# Patient Record
Sex: Female | Born: 1985 | Race: Black or African American | Hispanic: No | Marital: Single | Smoking: Never smoker
Health system: Southern US, Community
[De-identification: ages and names within clinical notes are randomized; demographics above are authoritative.]

## PROBLEM LIST (undated history)

## (undated) DIAGNOSIS — N760 Acute vaginitis: Secondary | ICD-10-CM

## (undated) DIAGNOSIS — F419 Anxiety disorder, unspecified: Secondary | ICD-10-CM

## (undated) DIAGNOSIS — F32A Depression, unspecified: Secondary | ICD-10-CM

## (undated) DIAGNOSIS — F329 Major depressive disorder, single episode, unspecified: Secondary | ICD-10-CM

## (undated) DIAGNOSIS — N920 Excessive and frequent menstruation with regular cycle: Secondary | ICD-10-CM

## (undated) DIAGNOSIS — R87619 Unspecified abnormal cytological findings in specimens from cervix uteri: Secondary | ICD-10-CM

## (undated) DIAGNOSIS — N289 Disorder of kidney and ureter, unspecified: Secondary | ICD-10-CM

## (undated) DIAGNOSIS — B9689 Other specified bacterial agents as the cause of diseases classified elsewhere: Secondary | ICD-10-CM

## (undated) HISTORY — DX: Acute vaginitis: N76.0

## (undated) HISTORY — DX: Major depressive disorder, single episode, unspecified: F32.9

## (undated) HISTORY — PX: CYSTOSCOPY: SUR368

## (undated) HISTORY — DX: Depression, unspecified: F32.A

## (undated) HISTORY — DX: Unspecified abnormal cytological findings in specimens from cervix uteri: R87.619

## (undated) HISTORY — DX: Anxiety disorder, unspecified: F41.9

## (undated) HISTORY — DX: Other specified bacterial agents as the cause of diseases classified elsewhere: B96.89

## (undated) HISTORY — DX: Excessive and frequent menstruation with regular cycle: N92.0

---
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
## 2008-04-04 ENCOUNTER — Inpatient Hospital Stay: Payer: Self-pay | Admitting: Obstetrics and Gynecology

## 2010-08-15 ENCOUNTER — Emergency Department: Payer: Self-pay | Admitting: Emergency Medicine

## 2010-10-23 ENCOUNTER — Emergency Department: Payer: Self-pay | Admitting: Emergency Medicine

## 2012-08-04 ENCOUNTER — Emergency Department: Payer: Self-pay | Admitting: Emergency Medicine

## 2012-08-04 LAB — URINALYSIS, COMPLETE
Bilirubin,UR: NEGATIVE
Blood: NEGATIVE
Glucose,UR: NEGATIVE mg/dL (ref 0–75)
Ketone: NEGATIVE
Nitrite: NEGATIVE
Ph: 9 (ref 4.5–8.0)
Protein: 30
Squamous Epithelial: 2
WBC UR: 7 /HPF (ref 0–5)

## 2012-08-04 LAB — COMPREHENSIVE METABOLIC PANEL
Albumin: 3.8 g/dL (ref 3.4–5.0)
Anion Gap: 7 (ref 7–16)
Bilirubin,Total: 0.3 mg/dL (ref 0.2–1.0)
Calcium, Total: 8.9 mg/dL (ref 8.5–10.1)
Creatinine: 0.72 mg/dL (ref 0.60–1.30)
EGFR (African American): >60
EGFR (Non-African Amer.): >60
SGPT (ALT): 21 U/L (ref 12–78)
Total Protein: 7.9 g/dL (ref 6.4–8.2)

## 2012-08-04 LAB — CBC
HCT: 36.9 % (ref 35.0–47.0)
HGB: 12.5 g/dL (ref 12.0–16.0)
MCV: 85 fL (ref 80–100)
Platelet: 289 10*3/uL (ref 150–440)
RDW: 13.4 % (ref 11.5–14.5)

## 2012-08-04 LAB — LIPASE, BLOOD: Lipase: 109 U/L (ref 73–393)

## 2017-02-14 ENCOUNTER — Emergency Department: Payer: BC Managed Care – PPO

## 2017-02-14 ENCOUNTER — Emergency Department
Admission: EM | Admit: 2017-02-14 | Discharge: 2017-02-14 | Disposition: A | Discharge status: Home / Self Care | Payer: BC Managed Care – PPO | Attending: Student in an Organized Health Care Education/Training Program | Admitting: Student in an Organized Health Care Education/Training Program

## 2017-02-14 ENCOUNTER — Other Ambulatory Visit: Payer: Self-pay

## 2017-02-14 DIAGNOSIS — R197 Diarrhea, unspecified: Secondary | ICD-10-CM

## 2017-02-14 DIAGNOSIS — R112 Nausea with vomiting, unspecified: Secondary | ICD-10-CM | POA: Insufficient documentation

## 2017-02-14 DIAGNOSIS — R1084 Generalized abdominal pain: Secondary | ICD-10-CM | POA: Insufficient documentation

## 2017-02-14 DIAGNOSIS — R1011 Right upper quadrant pain: Secondary | ICD-10-CM

## 2017-02-14 LAB — URINALYSIS, COMPLETE (UACMP) WITH MICROSCOPIC
Bacteria, UA: NONE SEEN
Bilirubin Urine: NEGATIVE
GLUCOSE, UA: NEGATIVE mg/dL
KETONES UR: 20 mg/dL — AB
Nitrite: NEGATIVE
PH: 5 (ref 5.0–8.0)
Protein, ur: NEGATIVE mg/dL
Specific Gravity, Urine: 1.019 (ref 1.005–1.030)

## 2017-02-14 LAB — CBC
HCT: 40.7 % (ref 35.0–47.0)
Hemoglobin: 13.7 g/dL (ref 12.0–16.0)
MCH: 28.5 pg (ref 26.0–34.0)
MCHC: 33.7 g/dL (ref 32.0–36.0)
MCV: 84.4 fL (ref 80.0–100.0)
PLATELETS: 243 10*3/uL (ref 150–440)
RBC: 4.82 MIL/uL (ref 3.80–5.20)
RDW: 13.5 % (ref 11.5–14.5)
WBC: 8.1 10*3/uL (ref 3.6–11.0)

## 2017-02-14 LAB — COMPREHENSIVE METABOLIC PANEL
ALT: 23 U/L (ref 14–54)
AST: 22 U/L (ref 15–41)
Albumin: 4.2 g/dL (ref 3.5–5.0)
Alkaline Phosphatase: 63 U/L (ref 38–126)
Anion gap: 11 (ref 5–15)
BUN: 8 mg/dL (ref 6–20)
CHLORIDE: 106 mmol/L (ref 101–111)
CO2: 22 mmol/L (ref 22–32)
CREATININE: 0.88 mg/dL (ref 0.44–1.00)
Calcium: 9.3 mg/dL (ref 8.9–10.3)
GFR calc non Af Amer: >60 mL/min (ref >60)
Glucose, Bld: 99 mg/dL (ref 65–99)
POTASSIUM: 3.8 mmol/L (ref 3.5–5.1)
Sodium: 139 mmol/L (ref 135–145)
Total Bilirubin: 0.5 mg/dL (ref 0.3–1.2)
Total Protein: 7.7 g/dL (ref 6.5–8.1)

## 2017-02-14 LAB — LIPASE, BLOOD: LIPASE: 31 U/L (ref 11–51)

## 2017-02-14 LAB — POCT PREGNANCY, URINE: Preg Test, Ur: NEGATIVE

## 2017-02-14 MED ORDER — PROMETHAZINE HCL 25 MG/ML IJ SOLN
25.0000 mg | Freq: Four times a day (QID) | INTRAMUSCULAR | Status: DC | PRN
  Filled 2017-02-14: qty 1

## 2017-02-14 MED ORDER — PROMETHAZINE HCL 12.5 MG PO TABS: 12.5000 mg | ORAL_TABLET | Freq: Four times a day (QID) | ORAL | 0 refills | Status: DC | PRN

## 2017-02-14 MED ORDER — DICYCLOMINE HCL 10 MG PO CAPS
10.0000 mg | ORAL_CAPSULE | Freq: Once | ORAL | Status: AC
Start: 2017-02-14 — End: 2017-02-14
  Administered 2017-02-14: 10 mg via ORAL
  Filled 2017-02-14: qty 1

## 2017-02-14 MED ORDER — DICYCLOMINE HCL 10 MG PO CAPS: 10.0000 mg | ORAL_CAPSULE | Freq: Three times a day (TID) | ORAL | 0 refills | Status: DC | PRN

## 2017-02-14 MED ORDER — KETOROLAC TROMETHAMINE 30 MG/ML IJ SOLN
15.0000 mg | Freq: Once | INTRAMUSCULAR | Status: AC
  Administered 2017-02-14: 15 mg via INTRAMUSCULAR
  Filled 2017-02-14: qty 1

## 2017-02-14 MED ORDER — PROMETHAZINE HCL 25 MG/ML IJ SOLN
25.0000 mg | Freq: Four times a day (QID) | INTRAMUSCULAR | Status: DC | PRN
  Administered 2017-02-14: 25 mg via INTRAMUSCULAR

## 2017-02-14 NOTE — ED Notes (Signed)
Pt states she has abd pain with n/v/d.  No back pain  Pt denies urinary sx. Pt alert.  Family with pt.

## 2017-02-14 NOTE — ED Provider Notes (Signed)
Jessica Madden    First MD Initiated Contact with Patient 02/14/17 2131     (approximate)  I have reviewed the triage vital signs and the nursing notes.   HISTORY  Chief Complaint Abdominal Pain; Emesis; and Diarrhea    HPI Jessica Madden is a 32 y.o. female presents with chief complaint of nausea vomiting diarrhea associated with crampy moderate to severe and intermittent abdominal pain primarily located in the epigastric area and right upper quadrant since yesterday.  Patient and her mother just recently [place] from [place].  Denies any blood in her diarrhea.  Denies any vaginal discharge or dysuria.  She is on Depakote.  Denies any chance of being pregnant.  No chest pain or shortness of breath.  No personal or family history of inflammatory bowel disease.  No past medical history on file. No family history on file.  There are no active problems to display for this patient.     Prior to Admission medications   Not on File    Allergies Patient has no known allergies.    Social History Social History   Tobacco Use  . Smoking status: Not on file  Substance Use Topics  . Alcohol use: Not on file  . Drug use: Not on file    Review of Systems Patient denies headaches, rhinorrhea, blurry vision, numbness, shortness of breath, chest pain, edema, cough, abdominal pain, nausea, vomiting, diarrhea, dysuria, fevers, rashes or hallucinations unless otherwise stated above in HPI. ____________________________________________   PHYSICAL EXAM:  VITAL SIGNS: Vitals:   02/14/17 1833  BP: 108/64  Pulse: 84  Resp: 18  Temp: 98.1 F (36.7 C)  SpO2: 100%    Constitutional: Alert and oriented. Well appearing and in no acute distress. Eyes: Conjunctivae are normal.  Head: Atraumatic. Nose: No congestion/rhinnorhea. Mouth/Throat: Mucous membranes are moist.   Neck: No stridor. Painless ROM.  Cardiovascular:  Normal rate, regular rhythm. Grossly normal heart sounds.  Good peripheral circulation. Respiratory: Normal respiratory effort.  No retractions. Lungs CTAB. Gastrointestinal: Soft and with mild epigastric ttp. No distention. No abdominal bruits. No CVA tenderness. Genitourinary: deferred Musculoskeletal: No lower extremity tenderness nor edema.  No joint effusions. Neurologic:  Normal speech and language. No gross focal neurologic deficits are appreciated. No facial droop Skin:  Skin is warm, dry and intact. No rash noted. Psychiatric: Mood and affect are normal. Speech and behavior are normal.  ____________________________________________   LABS (all labs ordered are listed, but only abnormal results are displayed)  Results for orders placed or performed during the hospital encounter of 02/14/17 (from the past 24 hour(s))  Lipase, blood     Status: None   Collection Time: 02/14/17  6:30 PM  Result Value Ref Range   Lipase 31 11 - 51 U/L  Comprehensive metabolic panel     Status: None   Collection Time: 02/14/17  6:30 PM  Result Value Ref Range   Sodium 139 135 - 145 mmol/L   Potassium 3.8 3.5 - 5.1 mmol/L   Chloride 106 101 - 111 mmol/L   CO2 22 22 - 32 mmol/L   Glucose, Bld 99 65 - 99 mg/dL   BUN 8 6 - 20 mg/dL   Creatinine, Ser 1.610.88 0.44 - 1.00 mg/dL   Calcium 9.3 8.9 - 09.610.3 mg/dL   Total Protein 7.7 6.5 - 8.1 g/dL   Albumin 4.2 3.5 - 5.0 g/dL   AST 22 15 - 41 U/L   ALT 23  14 - 54 U/L   Alkaline Phosphatase 63 38 - 126 U/L   Total Bilirubin 0.5 0.3 - 1.2 mg/dL   GFR calc non Af Amer >60 >60 mL/min   GFR calc Af Amer >60 >60 mL/min   Anion gap 11 5 - 15  CBC     Status: None   Collection Time: 02/14/17  6:30 PM  Result Value Ref Range   WBC 8.1 3.6 - 11.0 K/uL   RBC 4.82 3.80 - 5.20 MIL/uL   Hemoglobin 13.7 12.0 - 16.0 g/dL   HCT 16.1 09.6 - 04.5 %   MCV 84.4 80.0 - 100.0 fL   MCH 28.5 26.0 - 34.0 pg   MCHC 33.7 32.0 - 36.0 g/dL   RDW 40.9 81.1 - 91.4 %   Platelets  243 150 - 440 K/uL  Urinalysis, Complete w Microscopic     Status: Abnormal   Collection Time: 02/14/17  6:30 PM  Result Value Ref Range   Color, Urine YELLOW (A) YELLOW   APPearance CLOUDY (A) CLEAR   Specific Gravity, Urine 1.019 1.005 - 1.030   pH 5.0 5.0 - 8.0   Glucose, UA NEGATIVE NEGATIVE mg/dL   Hgb urine dipstick MODERATE (A) NEGATIVE   Bilirubin Urine NEGATIVE NEGATIVE   Ketones, ur 20 (A) NEGATIVE mg/dL   Protein, ur NEGATIVE NEGATIVE mg/dL   Nitrite NEGATIVE NEGATIVE   Leukocytes, UA MODERATE (A) NEGATIVE   RBC / HPF 6-30 0 - 5 RBC/hpf   WBC, UA 6-30 0 - 5 WBC/hpf   Bacteria, UA NONE SEEN NONE SEEN   Squamous Epithelial / LPF 6-30 (A) NONE SEEN   Mucus PRESENT   Pregnancy, urine POC     Status: None   Collection Time: 02/14/17  6:47 PM  Result Value Ref Range   Preg Test, Ur NEGATIVE NEGATIVE   ____________________________________________  __________________________________  RADIOLOGY  I personally reviewed all radiographic images ordered to evaluate for the above acute complaints and reviewed radiology reports and findings.  These findings were personally discussed with the patient.  Please see medical record for radiology report.  ____________________________________________   PROCEDURES  Procedure(s) performed:  Procedures    Critical Care performed: no ____________________________________________   INITIAL IMPRESSION / ASSESSMENT AND PLAN / ED COURSE  Pertinent labs & imaging results that were available during my care of the patient were reviewed by me and considered in my medical decision making (see chart for details).  DDX: cholecystitis, cholelithiasis, enteritis, gastritis, travelers diarrhea, appendicitis  Jessica Madden is a 32 y.o. who presents to the ED with crampy abdominal pain as described above.  Associated with diarrhea.  Patient is afebrile Heema dynamically stable.  Her blood work is reassuring.  Is not clinically consistent with  appendicitis.  Ultrasound ordered to rule out cholelithiasis cholecystitis.  Patient given IM Phenergan as well as Toradol with improvement in symptoms.  Do not feel that emergent CT imaging is clinically indicated.  Patient stable for a period of observation as an outpatient.  Encouraged patient to return to the ER or seek medical attention in 12-24 hours if symptoms not improved.  Have discussed with the patient and available family all diagnostics and treatments performed thus far and all questions were answered to the best of my ability. The patient demonstrates understanding and agreement with plan.       ____________________________________________   FINAL CLINICAL IMPRESSION(S) / ED DIAGNOSES  Final diagnoses:  RUQ abdominal pain  Diarrhea of presumed infectious origin  NEW MEDICATIONS STARTED DURING THIS VISIT:  New Prescriptions   No medications on file     Madden:  This document was prepared using Dragon voice recognition software and may include unintentional dictation errors.    Willy Eddy, MD 02/15/17 [facility]

## 2017-02-14 NOTE — Discharge Instructions (Signed)

## 2017-02-14 NOTE — ED Triage Notes (Signed)
Pt reports that she began yesterday having generalized abd pain with nausea, vomiting and diarrhea.

## 2017-03-14 ENCOUNTER — Ambulatory Visit (INDEPENDENT_AMBULATORY_CARE_PROVIDER_SITE_OTHER): Payer: BC Managed Care – PPO | Admitting: Maternal Newborn

## 2017-03-14 ENCOUNTER — Encounter: Payer: Self-pay | Admitting: Maternal Newborn

## 2017-03-14 VITALS — BP 100/70 | HR 84 | Ht 62.5 in | Wt 164.0 lb

## 2017-03-14 DIAGNOSIS — Z13220 Encounter for screening for lipoid disorders: Secondary | ICD-10-CM

## 2017-03-14 DIAGNOSIS — Z113 Encounter for screening for infections with a predominantly sexual mode of transmission: Secondary | ICD-10-CM | POA: Diagnosis not present

## 2017-03-14 DIAGNOSIS — Z124 Encounter for screening for malignant neoplasm of cervix: Secondary | ICD-10-CM

## 2017-03-14 DIAGNOSIS — Z01419 Encounter for gynecological examination (general) (routine) without abnormal findings: Secondary | ICD-10-CM

## 2017-03-14 DIAGNOSIS — Z3042 Encounter for surveillance of injectable contraceptive: Secondary | ICD-10-CM

## 2017-03-14 MED ORDER — MEDROXYPROGESTERONE ACETATE 150 MG/ML IM SUSY: 150.0000 mg | PREFILLED_SYRINGE | INTRAMUSCULAR | 4 refills | Status: DC

## 2017-03-14 NOTE — Progress Notes (Signed)
Gynecology Annual Exam  PCP: System, Pcp Not In  Chief Complaint:  Chief Complaint  Patient presents with  . Gynecologic Exam    Wants STI scr., HIV and chol; refill phentermine    History of Present Illness: Patient is a 32 y.o. [id number] presenting for annual exam. The patient has no complaints today.   LMP: No LMP recorded (lmp unknown). Patient has had an injection. Menses absent with DepoProvera. Postcoital Bleeding: no  The patient is sexually active. She currently uses Depo-Provera injections for contraception. She denies dyspareunia.  The patient does not perform self breast exams.  There is no notable family history of breast or ovarian cancer in her family.  The patient wears seatbelts: yes.   The patient has regular exercise: no.    The patient denies current symptoms of depression.    Review of Systems  Constitutional: Negative for malaise/fatigue and weight loss.  HENT: Negative.   Eyes: Negative.   Respiratory: Negative for cough, shortness of breath and wheezing.   Cardiovascular: Negative for chest pain and palpitations.  Gastrointestinal: Negative for abdominal pain, constipation, diarrhea, heartburn and nausea.  Genitourinary: Negative.   Musculoskeletal: Negative.   Skin: Negative.   Neurological: Negative.   Endo/Heme/Allergies: Negative.   Psychiatric/Behavioral: Negative for depression. The patient is not nervous/anxious.   All other systems reviewed and are negative.   Past Medical History:  Past Medical History:  Diagnosis Date  . Abnormal Pap smear of cervix 2007  . Anxiety   . Bacterial vaginosis   . Depression   . Menorrhagia     Past Surgical History:  History reviewed. No pertinent surgical history.  Gynecologic History:  No LMP recorded (lmp unknown). Patient has had an injection. Contraception: Depo-Provera injections Last Pap: 03/04/2016  Results were: NIL  Obstetric History: G2P2002  Family History:  Family History    Problem Relation Age of Onset  . Diabetes Mother   . Hypertension Mother   . Diabetes Maternal Grandfather   . Cancer Maternal Grandfather 15       lung    Social History:  Social History   Socioeconomic History  . Marital status: Single    Spouse name: Not on file  . Number of children: 2  . Years of education: 35  . Highest education level: Not on file  Social Needs  . Financial resource strain: Not on file  . Food insecurity - worry: Not on file  . Food insecurity - inability: Not on file  . Transportation needs - medical: Not on file  . Transportation needs - non-medical: Not on file  Occupational History  . Occupation: Engineer, site    Employer: [employer]  Tobacco Use  . Smoking status: Never Smoker  . Smokeless tobacco: Never Used  Substance and Sexual Activity  . Alcohol use: Yes    Comment: rarely  . Drug use: No  . Sexual activity: Yes    Birth control/protection: Injection  Other Topics Concern  . Not on file  Social History Narrative  . Not on file    Allergies:  Allergies  Allergen Reactions  . Pineapple Rash    Medications: Prior to Admission medications   Medication Sig Start Date End Date Taking? Authorizing Provider  MedroxyPROGESTERone Acetate 150 MG/ML SUSY Inject 1 mL (150 mg total) into the muscle every 3 (three) months. 03/14/17  Yes Oswaldo Conroy, CNM  triamcinolone ointment (KENALOG) 0.1 % Apply 1 application topically as needed. 04/27/16 04/27/17 Yes [provider]    Physical Exam Vitals: Blood pressure 100/70, pulse 84, height 5' 2.5" (1.588 m), weight 164 lb (74.4 kg).  General: NAD HEENT: normocephalic, anicteric Thyroid: no enlargement, no palpable nodules Pulmonary: No increased work of breathing, CTAB Cardiovascular: RRR, S1 and S2 auscultated, no murmurs, rubs or gallops Breast: Breasts symmetrical, no tenderness, no palpable nodules or masses, no skin or nipple retraction present, no nipple discharge.   No axillary or supraclavicular lymphadenopathy. Abdomen: soft, non-tender, non-distended.  Umbilicus without lesions.  No hepatomegaly, splenomegaly or masses palpable. No evidence of hernia  Genitourinary:  External: Normal external female genitalia.  Normal urethral  meatus, normal Bartholin's and Skene's glands.    Vagina: Normal vaginal mucosa, no evidence of prolapse.    Cervix: Grossly normal in appearance, no bleeding  Uterus: Non-enlarged, mobile, normal contour.  No CMT  Adnexa: ovaries non-enlarged, no adnexal masses  Rectal: deferred  Lymphatic: no evidence of inguinal lymphadenopathy Extremities: no edema, erythema, or tenderness Neurologic: Grossly intact Psychiatric: mood appropriate, affect full  Assessment: 32 y.o. G2P2002 routine annual exam.  Plan: Problem List Items Addressed This Visit    None    Visit Diagnoses    Pap smear for cervical cancer screening    -  Primary   Relevant Orders   Pap IG, Ct-Ng TV HPV-hr   Routine screening for STI (sexually transmitted infection)       Relevant Orders   HEP, RPR, HIV Panel   Hepatitis C antibody   HSV(herpes smplx)abs-1+2(IgG+IgM)-bld   Pap IG, Ct-Ng TV HPV-hr   Encounter for surveillance of injectable contraceptive       Relevant Medications   MedroxyPROGESTERone Acetate 150 MG/ML SUSY   Screening for cholesterol level       Relevant Orders   Lipid panel      1) STI screening was offered and accepted.  2) ASCCP guidelines and rationale discussed.  Patient opts for yearly screening interval.  3) Contraception - Patient currently satisfied with DepoProvera and wishes to continue injectable contraception.  4) Routine healthcare maintenance including cholesterol, diabetes screening discussed: cholesterol screening ordered today.  5) Desires phentermine Rx, advised to make visit with MD for ongoing supervision and refill.  6) Follow up 1 year for routine annual exam.  Marcelyn BruinsJacelyn Reiana Poteet, CNM 03/14/2017  4:56  PM

## 2017-03-16 LAB — HSV(HERPES SMPLX)ABS-I+II(IGG+IGM)-BLD
HSV 1 Glycoprotein G Ab, IgG: 39.6 index — ABNORMAL HIGH (ref 0.00–0.90)
HSVI/II COMB AB IGM: 1.7 ratio — AB (ref 0.00–0.90)

## 2017-03-16 LAB — HEPATITIS C ANTIBODY

## 2017-03-16 LAB — HEP, RPR, HIV PANEL
HEP B S AG: NEGATIVE
HIV Screen 4th Generation wRfx: NONREACTIVE
RPR Ser Ql: NONREACTIVE

## 2017-03-17 LAB — PAP IG, CT-NG TV HPV-HR
CHLAMYDIA, NUC. ACID AMP: NEGATIVE
GONOCOCCUS, NUC. ACID AMP: NEGATIVE
HPV, HIGH-RISK: NEGATIVE
PAP SMEAR COMMENT: 0
Trich vag by NAA: NEGATIVE

## 2017-03-24 ENCOUNTER — Ambulatory Visit: Payer: Self-pay | Admitting: Advanced Practice Midwife

## 2017-03-29 ENCOUNTER — Ambulatory Visit: Payer: Self-pay | Admitting: Advanced Practice Midwife

## 2017-07-08 ENCOUNTER — Encounter: Payer: Self-pay | Admitting: Emergency Medicine

## 2017-07-08 DIAGNOSIS — N23 Unspecified renal colic: Secondary | ICD-10-CM | POA: Diagnosis not present

## 2017-07-08 DIAGNOSIS — N132 Hydronephrosis with renal and ureteral calculous obstruction: Secondary | ICD-10-CM | POA: Diagnosis not present

## 2017-07-08 DIAGNOSIS — R109 Unspecified abdominal pain: Secondary | ICD-10-CM | POA: Diagnosis present

## 2017-07-08 LAB — URINALYSIS, COMPLETE (UACMP) WITH MICROSCOPIC
Bacteria, UA: NONE SEEN
Bilirubin Urine: NEGATIVE
Glucose, UA: NEGATIVE mg/dL
KETONES UR: NEGATIVE mg/dL
Leukocytes, UA: NEGATIVE
Nitrite: NEGATIVE
PH: 6 (ref 5.0–8.0)
PROTEIN: NEGATIVE mg/dL
Specific Gravity, Urine: 1.021 (ref 1.005–1.030)

## 2017-07-08 LAB — COMPREHENSIVE METABOLIC PANEL
ALBUMIN: 3.8 g/dL (ref 3.5–5.0)
ALT: 16 U/L (ref 14–54)
AST: 21 U/L (ref 15–41)
Alkaline Phosphatase: 65 U/L (ref 38–126)
Anion gap: 11 (ref 5–15)
BUN: 13 mg/dL (ref 6–20)
CHLORIDE: 105 mmol/L (ref 101–111)
CO2: 22 mmol/L (ref 22–32)
CREATININE: 1.14 mg/dL — AB (ref 0.44–1.00)
Calcium: 9.1 mg/dL (ref 8.9–10.3)
GFR calc non Af Amer: >60 mL/min (ref >60)
Glucose, Bld: 97 mg/dL (ref 65–99)
Potassium: 3.8 mmol/L (ref 3.5–5.1)
SODIUM: 138 mmol/L (ref 135–145)
Total Bilirubin: 0.4 mg/dL (ref 0.3–1.2)
Total Protein: 7.1 g/dL (ref 6.5–8.1)

## 2017-07-08 LAB — LIPASE, BLOOD: LIPASE: 28 U/L (ref 11–51)

## 2017-07-08 LAB — CBC
HCT: 39.4 % (ref 35.0–47.0)
Hemoglobin: 13.1 g/dL (ref 12.0–16.0)
MCH: 28.2 pg (ref 26.0–34.0)
MCHC: 33.2 g/dL (ref 32.0–36.0)
MCV: 85.2 fL (ref 80.0–100.0)
PLATELETS: 251 10*3/uL (ref 150–440)
RBC: 4.63 MIL/uL (ref 3.80–5.20)
RDW: 13 % (ref 11.5–14.5)
WBC: 14 10*3/uL — ABNORMAL HIGH (ref 3.6–11.0)

## 2017-07-08 LAB — POCT PREGNANCY, URINE: Preg Test, Ur: NEGATIVE

## 2017-07-08 NOTE — ED Triage Notes (Signed)
Patient states that she fell down her steps. Patient states that Wednesday she started having lower back pain radiating to her abdomen and nausea. Patient is unsure if the pain is from the fall. Patient denies any urinary symptoms.

## 2017-07-09 ENCOUNTER — Emergency Department: Payer: BC Managed Care – PPO

## 2017-07-09 ENCOUNTER — Emergency Department
Admission: EM | Admit: 2017-07-09 | Discharge: 2017-07-09 | Disposition: A | Discharge status: Home / Self Care | Payer: BC Managed Care – PPO | Attending: Emergency Medicine | Admitting: Emergency Medicine

## 2017-07-09 DIAGNOSIS — N23 Unspecified renal colic: Secondary | ICD-10-CM

## 2017-07-09 MED ORDER — KETOROLAC TROMETHAMINE 30 MG/ML IJ SOLN
15.0000 mg | Freq: Once | INTRAMUSCULAR | Status: AC
Start: 2017-07-09 — End: 2017-07-09
  Administered 2017-07-09: 15 mg via INTRAMUSCULAR
  Filled 2017-07-09: qty 1

## 2017-07-09 NOTE — ED Notes (Signed)
ED Provider at bedside. 

## 2017-07-09 NOTE — ED Notes (Signed)
Pt just up to registration desk with steady gait to check on wait time; updated on times and pt verbalized understanding

## 2017-07-09 NOTE — Discharge Instructions (Signed)
Based on your CT scan, it appears that you have just recently passed a 3 mm stone from your left ureter into your bladder.  Fortunately, the acute discomfort should resolve soon.  However, you continue to have multiple kidney stones on each side, and will likely have additional issues in the future.  We recommend follow-up with Dr. Apolinar JunesBrandon with urology for further evaluation and treatment recommendations.

## 2017-07-09 NOTE — ED Notes (Signed)
Patient transported to CT 

## 2017-07-09 NOTE — ED Provider Notes (Signed)
[facility] Emergency Department Provider Note  ____________________________________________   First MD Initiated Contact with Patient 07/09/17 0320     (approximate)  I have reviewed the triage vital signs and the nursing notes.   HISTORY  Chief Complaint Flank Pain and Nausea    HPI Jessica Madden is a 32 y.o. female with PMH as listed below who presents for evaluation of acute onset pain in her left flank several days ago.  She reports that she had a fall about 5 days ago and a little bit of soreness on the left side afterwards, but it became acutely worse about 2 days ago.  She was at work and suddenly she had sharp stabbing pain in her left flank that radiates around to the front of her abdomen.  Nothing in particular made it better or worse and she could not find a position of comfort.  It is waxed and waned tonight so she felt like she should get it evaluated.  She denies dysuria, hematuria.  Has hx of kidney stones.  She denies fever/chills, chest pain, shortness of breath, vomiting, and diarrhea.  She has had some severe nausea during the pain as well.  Past Medical History:  Diagnosis Date  . Abnormal Pap smear of cervix 2007  . Anxiety   . Bacterial vaginosis   . Depression   . Menorrhagia     There are no active problems to display for this patient.   History reviewed. No pertinent surgical history.  Prior to Admission medications   Medication Sig Start Date End Date Taking? Authorizing Provider  MedroxyPROGESTERone Acetate 150 MG/ML SUSY Inject 1 mL (150 mg total) into the muscle every 3 (three) months. 03/14/17   Oswaldo ConroySchmid, Jacelyn Y, CNM    Allergies Pineapple  Family History  Problem Relation Age of Onset  . Diabetes Mother   . Hypertension Mother   . Diabetes Maternal Grandfather   . Cancer Maternal Grandfather 4850       lung    Social History Social History   Tobacco Use  . Smoking status: Never Smoker  . Smokeless tobacco:  Never Used  Substance Use Topics  . Alcohol use: Not Currently  . Drug use: No    Review of Systems Constitutional: No fever/chills Eyes: No visual changes. ENT: No sore throat. Cardiovascular: Denies chest pain. Respiratory: Denies shortness of breath. Gastrointestinal: Left flank pain radiating around to the left side of her abdomen as described above.  Nausea, no vomiting.  No diarrhea.  No constipation. Genitourinary: Negative for dysuria.  No hematuria Musculoskeletal: Negative for neck pain.  Negative for back pain except for the left flank pain described above Integumentary: Negative for rash. Neurological: Negative for headaches, focal weakness or numbness.   ____________________________________________   PHYSICAL EXAM:  VITAL SIGNS: ED Triage Vitals  Enc Vitals Group     BP 07/08/17 2258 122/67     Pulse Rate 07/08/17 2258 97     Resp 07/08/17 2258 18     Temp 07/08/17 2258 98.6 F (37 C)     Temp Source 07/08/17 2258 Oral     SpO2 07/08/17 2258 100 %     Weight 07/08/17 2300 76.2 kg (168 lb)     Height 07/08/17 2300 1.6 m (5\' 3" )     Head Circumference --      Peak Flow --      Pain Score 07/08/17 2300 10     Pain Loc --  Pain Edu? --      Excl. in GC? --     Constitutional: Alert and oriented. Well appearing and in no acute distress. Eyes: Conjunctivae are normal.  Head: Atraumatic. Nose: No congestion/rhinnorhea. Mouth/Throat: Mucous membranes are moist. Neck: No stridor.  No meningeal signs.   Cardiovascular: Normal rate, regular rhythm. Good peripheral circulation. Grossly normal heart sounds. Respiratory: Normal respiratory effort.  No retractions. Lungs CTAB. Gastrointestinal: Soft and nontender. No distention.  Musculoskeletal: No lower extremity tenderness nor edema. No gross deformities of extremities.  Severe left CVA tenderness to percussion. Neurologic:  Normal speech and language. No gross focal neurologic deficits are appreciated.    Skin:  Skin is warm, dry and intact. No rash noted. Psychiatric: Mood and affect are normal. Speech and behavior are normal.  ____________________________________________   LABS (all labs ordered are listed, but only abnormal results are displayed)  Labs Reviewed  COMPREHENSIVE METABOLIC PANEL - Abnormal; Notable for the following components:      Result Value   Creatinine, Ser 1.14 (*)    All other components within normal limits  CBC - Abnormal; Notable for the following components:   WBC 14.0 (*)    All other components within normal limits  URINALYSIS, COMPLETE (UACMP) WITH MICROSCOPIC - Abnormal; Notable for the following components:   Color, Urine YELLOW (*)    APPearance CLEAR (*)    Hgb urine dipstick SMALL (*)    All other components within normal limits  LIPASE, BLOOD  POCT PREGNANCY, URINE  POC URINE PREG, ED   ____________________________________________  EKG  No EKG indicated ____________________________________________  RADIOLOGY Marylou Mccoy, personally viewed and evaluated these images (plain radiographs) as part of my medical decision making, as well as reviewing the written report by the radiologist.  ED MD interpretation:  No acute abnormality on radiograph.  Probably recently passed left ureteral stone on CT scan.  Official radiology report(s): Dg Abdomen 1 View  Result Date: 07/09/2017 CLINICAL DATA:  Acute onset of lower back pain radiating to the abdomen. Nausea. Status post fall down steps. EXAM: ABDOMEN - 1 VIEW COMPARISON:  Right upper quadrant ultrasound performed 02/14/2017 FINDINGS: The visualized bowel gas pattern is unremarkable. Scattered air and stool filled loops of colon are seen; no abnormal dilatation of small bowel loops is seen to suggest small bowel obstruction. No free intra-abdominal air is identified, though evaluation for free air is limited on supine views. The visualized osseous structures are within normal limits; the  sacroiliac joints are unremarkable in appearance. IMPRESSION: Unremarkable bowel gas pattern; no free intra-abdominal air seen. Small to moderate amount of stool noted in the colon. Electronically Signed   By: Roanna Raider M.D.   On: 07/09/2017 03:09   Ct Renal Stone Study  Result Date: 07/09/2017 CLINICAL DATA:  Status post fall down steps, with lower back pain radiating to the abdomen. Nausea. EXAM: CT ABDOMEN AND PELVIS WITHOUT CONTRAST TECHNIQUE: Multidetector CT imaging of the abdomen and pelvis was performed following the standard protocol without IV contrast. COMPARISON:  Abdominal radiograph performed earlier today at 2:44 a.m. FINDINGS: Lower chest: The visualized lung bases are grossly clear. The visualized portions of the mediastinum are unremarkable. Hepatobiliary: The liver is unremarkable in appearance. The gallbladder is unremarkable in appearance. The common bile duct remains normal in caliber. Pancreas: The pancreas is within normal limits. Spleen: The spleen is unremarkable in appearance. Adrenals/Urinary Tract: The adrenal glands are unremarkable in appearance. There is mild left-sided hydronephrosis, with a large left-sided  extrarenal pelvis, and distention of the left ureter along much of its course. No distal stone is seen. There is a 3 mm stone at the left side of the base of the bladder, likely reflecting a recently passed stone. Scattered nonobstructing bilateral renal stones measure up to 6 mm in size. No significant perinephric stranding is seen. Stomach/Bowel: The stomach is unremarkable in appearance. The small bowel is within normal limits. The appendix is mildly prominent and contains a small appendicolith, without significant soft tissue inflammation to suggest appendicitis. The colon is unremarkable in appearance. Vascular/Lymphatic: The abdominal aorta is unremarkable in appearance. The inferior vena cava is grossly unremarkable. No retroperitoneal lymphadenopathy is seen. No  pelvic sidewall lymphadenopathy is identified. Reproductive: The bladder is mildly distended and otherwise unremarkable. The uterus is grossly unremarkable. The ovaries are relatively symmetric. No suspicious adnexal masses are seen. Other: No additional soft tissue abnormalities are seen. Musculoskeletal: No acute osseous abnormalities are identified. The visualized musculature is unremarkable in appearance. IMPRESSION: 1. Mild left-sided hydronephrosis, with a large left-sided extrarenal pelvis, and distention of the left ureter along much of its course. No distal stone seen. 3 mm stone noted at the left side of the base of the bladder, likely reflecting a recently passed stone. 2. Scattered nonobstructing bilateral renal stones measure up to 6 mm in size. Electronically Signed   By: Roanna Raider M.D.   On: 07/09/2017 04:06    ____________________________________________   PROCEDURES  Critical Care performed: No   Procedure(s) performed:   Procedures   ____________________________________________   INITIAL IMPRESSION / ASSESSMENT AND PLAN / ED COURSE  As part of my medical decision making, I reviewed the following data within the electronic MEDICAL RECORD NUMBER Nursing notes reviewed and incorporated, Labs reviewed , Old chart reviewed and Notes from prior ED visits    Differential diagnosis includes, but is not limited to, musculoskeletal strain, ureteral colic, UTI/pyelonephritis, hematoma from recent fall.  Patient's vital signs are stable and she is afebrile.  Mild leukocytosis of 14 which could be from pain, lipase and CMP are normal, U pregnant is negative, urinalysis demonstrates red blood cells.  Radiograph was nondiagnostic for kidney stones, I will proceed with CT renal stone protocol.  Clinical Course as of Jul 10 454  Sun Jul 09, 2017  0448 CT report strongly suggest that the patient recently passed a 3 mm stone from her left ureter into her bladder.  This is certainly  consistent with her clinical presentation.  Patient has had kidney stones in the past and continues to have bilateral scattered renal stones.  I updated her about the information.  She reports that she Artie has oxycodone and Zofran at home.  I will give her an intramuscular injection of Toradol 15 mg prior to discharge and she is comfortable with plan for outpatient follow-up. I gave my usual and customary return precautions.    [CF]    Clinical Course User Index [CF] Loleta Rose, MD    ____________________________________________  FINAL CLINICAL IMPRESSION(S) / ED DIAGNOSES  Final diagnoses:  Ureteral colic     MEDICATIONS GIVEN DURING THIS VISIT:  Medications  ketorolac (TORADOL) 30 MG/ML injection 15 mg (has no administration in time range)     ED Discharge Orders    None       Note:  This document was prepared using Dragon voice recognition software and may include unintentional dictation errors.    Loleta Rose, MD 07/09/17 [phone]

## 2017-10-23 ENCOUNTER — Emergency Department
Admission: EM | Admit: 2017-10-23 | Discharge: 2017-10-23 | Disposition: A | Discharge status: Home / Self Care | Payer: BC Managed Care – PPO | Attending: Emergency Medicine | Admitting: Emergency Medicine

## 2017-10-23 ENCOUNTER — Emergency Department: Payer: BC Managed Care – PPO

## 2017-10-23 ENCOUNTER — Other Ambulatory Visit: Payer: Self-pay

## 2017-10-23 ENCOUNTER — Encounter: Payer: Self-pay | Admitting: Emergency Medicine

## 2017-10-23 DIAGNOSIS — N390 Urinary tract infection, site not specified: Secondary | ICD-10-CM

## 2017-10-23 DIAGNOSIS — R110 Nausea: Secondary | ICD-10-CM | POA: Insufficient documentation

## 2017-10-23 DIAGNOSIS — R319 Hematuria, unspecified: Secondary | ICD-10-CM | POA: Diagnosis not present

## 2017-10-23 DIAGNOSIS — N201 Calculus of ureter: Secondary | ICD-10-CM | POA: Insufficient documentation

## 2017-10-23 DIAGNOSIS — R109 Unspecified abdominal pain: Secondary | ICD-10-CM | POA: Diagnosis present

## 2017-10-23 HISTORY — DX: Disorder of kidney and ureter, unspecified: N28.9

## 2017-10-23 LAB — CBC
HCT: 39.3 % (ref 35.0–47.0)
HEMOGLOBIN: 13.2 g/dL (ref 12.0–16.0)
MCH: 28.4 pg (ref 26.0–34.0)
MCHC: 33.5 g/dL (ref 32.0–36.0)
MCV: 84.6 fL (ref 80.0–100.0)
Platelets: 286 10*3/uL (ref 150–440)
RBC: 4.65 MIL/uL (ref 3.80–5.20)
RDW: 13.1 % (ref 11.5–14.5)
WBC: 14.2 10*3/uL — AB (ref 3.6–11.0)

## 2017-10-23 LAB — BASIC METABOLIC PANEL
ANION GAP: 9 (ref 5–15)
BUN: 12 mg/dL (ref 6–20)
CHLORIDE: 108 mmol/L (ref 98–111)
CO2: 21 mmol/L — ABNORMAL LOW (ref 22–32)
Calcium: 9.1 mg/dL (ref 8.9–10.3)
Creatinine, Ser: 1 mg/dL (ref 0.44–1.00)
GFR calc Af Amer: >60 mL/min (ref >60)
GLUCOSE: 115 mg/dL — AB (ref 70–99)
POTASSIUM: 4 mmol/L (ref 3.5–5.1)
Sodium: 138 mmol/L (ref 135–145)

## 2017-10-23 LAB — URINALYSIS, COMPLETE (UACMP) WITH MICROSCOPIC
BILIRUBIN URINE: NEGATIVE
Glucose, UA: NEGATIVE mg/dL
KETONES UR: NEGATIVE mg/dL
NITRITE: NEGATIVE
PROTEIN: NEGATIVE mg/dL
Specific Gravity, Urine: 1.026 (ref 1.005–1.030)
pH: 5 (ref 5.0–8.0)

## 2017-10-23 LAB — POCT PREGNANCY, URINE: PREG TEST UR: NEGATIVE

## 2017-10-23 MED ORDER — KETOROLAC TROMETHAMINE 60 MG/2ML IM SOLN
60.0000 mg | Freq: Once | INTRAMUSCULAR | Status: AC
  Administered 2017-10-23: 60 mg via INTRAMUSCULAR

## 2017-10-23 MED ORDER — ONDANSETRON 4 MG PO TBDP
ORAL_TABLET | ORAL | Status: AC
  Filled 2017-10-23: qty 1

## 2017-10-23 MED ORDER — KETOROLAC TROMETHAMINE 30 MG/ML IJ SOLN: 15.0000 mg | Freq: Once | INTRAMUSCULAR | Status: DC

## 2017-10-23 MED ORDER — ONDANSETRON 4 MG PO TBDP
4.0000 mg | ORAL_TABLET | Freq: Once | ORAL | Status: AC
  Administered 2017-10-23: 4 mg via ORAL

## 2017-10-23 MED ORDER — CEPHALEXIN 500 MG PO CAPS: 500.0000 mg | ORAL_CAPSULE | Freq: Two times a day (BID) | ORAL | 0 refills | Status: DC

## 2017-10-23 MED ORDER — KETOROLAC TROMETHAMINE 10 MG PO TABS: 10.0000 mg | ORAL_TABLET | Freq: Three times a day (TID) | ORAL | 0 refills | Status: DC | PRN

## 2017-10-23 MED ORDER — OXYCODONE-ACETAMINOPHEN 5-325 MG PO TABS: 1.0000 | ORAL_TABLET | ORAL | 0 refills | Status: DC | PRN

## 2017-10-23 MED ORDER — KETOROLAC TROMETHAMINE 60 MG/2ML IM SOLN
INTRAMUSCULAR | Status: AC
  Filled 2017-10-23: qty 2

## 2017-10-23 MED ORDER — CEPHALEXIN 500 MG PO CAPS
500.0000 mg | ORAL_CAPSULE | Freq: Once | ORAL | Status: AC
  Administered 2017-10-23: 500 mg via ORAL
  Filled 2017-10-23: qty 1

## 2017-10-23 MED ORDER — SODIUM CHLORIDE 0.9 % IV BOLUS
1000.0000 mL | Freq: Once | INTRAVENOUS | Status: AC
  Administered 2017-10-23: 1000 mL via INTRAVENOUS

## 2017-10-23 NOTE — ED Provider Notes (Signed)
[facility] Emergency Department Provider Note ____________________________________________   I have reviewed the triage vital signs and the triage nursing note.  HISTORY  Chief Complaint Flank Pain   Historian Patient  HPI Jessica Madden is a 32 y.o. female presents with right flank pain for about 2 days similar to prior episodes of kidney stones.  States that she has had kidney stones 4 times previously.  Received oxycodone prescription at urgent care yesterday for symptoms consistent with kidney stone and stated she is been taking 5 mill grams every 4 hours, and this morning had severe right flank pain.  Denies fever.  Positive for nausea.  Denies vaginal discharge.  Denies pelvic pain.  Denies vaginal bleeding.     Past Medical History:  Diagnosis Date  . Abnormal Pap smear of cervix 2007  . Anxiety   . Bacterial vaginosis   . Depression   . Menorrhagia   . Renal disorder    kidney stones    There are no active problems to display for this patient.   History reviewed. No pertinent surgical history.  Prior to Admission medications   Medication Sig Start Date End Date Taking? Authorizing Provider  cephALEXin (KEFLEX) 500 MG capsule Take 1 capsule (500 mg total) by mouth 2 (two) times daily. 10/23/17   Governor Rooks, MD  ketorolac (TORADOL) 10 MG tablet Take 1 tablet (10 mg total) by mouth every 8 (eight) hours as needed for moderate pain. 10/23/17   Governor Rooks, MD  MedroxyPROGESTERone Acetate 150 MG/ML SUSY Inject 1 mL (150 mg total) into the muscle every 3 (three) months. 03/14/17   Oswaldo Conroy, CNM  oxyCODONE-acetaminophen (PERCOCET) 5-325 MG tablet Take 1-2 tablets by mouth every 4 (four) hours as needed for severe pain. 10/23/17   Governor Rooks, MD    Allergies  Allergen Reactions  . Pineapple Rash    Family History  Problem Relation Age of Onset  . Diabetes Mother   . Hypertension Mother   . Diabetes Maternal Grandfather   .  Cancer Maternal Grandfather 69       lung    Social History Social History   Tobacco Use  . Smoking status: Never Smoker  . Smokeless tobacco: Never Used  Substance Use Topics  . Alcohol use: Not Currently  . Drug use: No    Review of Systems  Constitutional: Negative for fever. Eyes: Negative for visual changes. ENT: Negative for sore throat. Cardiovascular: Negative for chest pain. Respiratory: Negative for shortness of breath. Gastrointestinal: Negative for abdominal pain, vomiting and diarrhea. Genitourinary: Positive for dysuria. Musculoskeletal: Positive for right flank pain as per HPI.   Skin: Negative for rash. Neurological: Negative for headache.  ____________________________________________   PHYSICAL EXAM:  VITAL SIGNS: ED Triage Vitals  Enc Vitals Group     BP 10/23/17 0830 110/68     Pulse Rate 10/23/17 0830 64     Resp 10/23/17 0830 20     Temp 10/23/17 0830 98 F (36.7 C)     Temp Source 10/23/17 0830 Oral     SpO2 10/23/17 0830 100 %     Weight 10/23/17 0831 172 lb (78 kg)     Height 10/23/17 0831 5' 2.5" (1.588 m)     Head Circumference --      Peak Flow --      Pain Score 10/23/17 0830 10     Pain Loc --      Pain Edu? --      Excl.  in GC? --      Constitutional: Alert and oriented.  HEENT      Head: Normocephalic and atraumatic.      Eyes: Conjunctivae are normal. Pupils equal and round.       Ears:         Nose: No congestion/rhinnorhea.      Mouth/Throat: Mucous membranes are moist.      Neck: No stridor. Cardiovascular/Chest: Normal rate, regular rhythm.  No murmurs, rubs, or gallops. Respiratory: Normal respiratory effort without tachypnea nor retractions. Breath sounds are clear and equal bilaterally. No wheezes/rales/rhonchi. Gastrointestinal: Soft. No distention, no guarding, no rebound. Nontender.    Genitourinary/rectal:Deferred Musculoskeletal: Nontender with normal range of motion in all extremities. No joint effusions.   No lower extremity tenderness.  No edema. Neurologic:  Normal speech and language. No gross or focal neurologic deficits are appreciated. Skin:  Skin is warm, dry and intact. No rash noted. Psychiatric: Mood and affect are normal. Speech and behavior are normal. Patient exhibits appropriate insight and judgment.   ____________________________________________  LABS (pertinent positives/negatives) I, Governor Rooksebecca Jayli Fogleman, MD the attending physician have reviewed the labs noted below.  Labs Reviewed  BASIC METABOLIC PANEL - Abnormal; Notable for the following components:      Result Value   CO2 21 (*)    Glucose, Bld 115 (*)    All other components within normal limits  CBC - Abnormal; Notable for the following components:   WBC 14.2 (*)    All other components within normal limits  URINALYSIS, COMPLETE (UACMP) WITH MICROSCOPIC - Abnormal; Notable for the following components:   Color, Urine YELLOW (*)    APPearance CLOUDY (*)    Hgb urine dipstick MODERATE (*)    Leukocytes, UA SMALL (*)    Bacteria, UA RARE (*)    All other components within normal limits  URINE CULTURE  POCT PREGNANCY, URINE    ____________________________________________    EKG I, Governor Rooksebecca Zenya Hickam, MD, the attending physician have personally viewed and interpreted all ECGs.  None ____________________________________________  RADIOLOGY   CT scan renal stone study, radiologist interpretation reviewed: Obstructing 2 mm right UVJ calculus.  Bilateral nephrolithiasis. __________________________________________  PROCEDURES  Procedure(s) performed: None  Procedures  Critical Care performed: None   ____________________________________________  ED COURSE / ASSESSMENT AND PLAN  Pertinent labs & imaging results that were available during my care of the patient were reviewed by me and considered in my medical decision making (see chart for details).    Upon patient's arrival to ED room, she had received Toradol  and is now stating her pain is much well under control at about 3 out of 10.  No systemic symptoms.  Reviewed urinalysis, questionable contamination, however given bacteria and white blood cells, and the kidney stone confirmed on CT discussed sending culture but also starting treatment with Keflex.  Denies pelvic or vaginal symptoms and declines pelvic exam.  Patient requesting IV fluids as she states that she just has trouble drinking water.  I am to go ahead and give her liter fluid prior to discharge.  No renal failure.  We discussed size is appropriate for continued outpatient management.  I am going to add p.o. Toradol.  She has p.o. Zofran/sublingual Zofran at home.  I am going to provide an additional prescription for oxycodone for pain relief.  She understands next step is to obtain stone and turned in for analysis.  I will refer her to outpatient urology.     CONSULTATIONS: None  Patient / Family / Caregiver informed of clinical course, medical decision-making process, and agree with plan.   I discussed return precautions, follow-up instructions, and discharge instructions with patient and/or family.  Discharge Instructions : You are found to have right-sided kidney stone and possible urinary tract infection for which he can go ahead and start treatment although a urine culture has been sent.  Return to the emergency department immediately for any worsening condition including fever, vomiting and cannot keep medications down, uncontrolled pain, or inability to urinate.  Please collect her stone and turn into primary care office lab or urologist.    ___________________________________________   FINAL CLINICAL IMPRESSION(S) / ED DIAGNOSES   Final diagnoses:  Ureterolithiasis  Urinary tract infection with hematuria, site unspecified      ___________________________________________         Note: This dictation was prepared with Dragon dictation. Any  transcriptional errors that result from this process are unintentional    Governor Rooks, MD 10/23/17 1046

## 2017-10-23 NOTE — ED Notes (Signed)
Pt awaiting for IVFluids to complete, then to be discharged. Ride at bedside.

## 2017-10-23 NOTE — ED Notes (Signed)
Report to Jordan, RN

## 2017-10-23 NOTE — ED Triage Notes (Addendum)
C/O right flank pain since Saturday.  Patient has history of Kidney Stones.  Was [facility].  Started on Oxycodone and flomax.

## 2017-10-23 NOTE — Discharge Instructions (Addendum)
You are found to have right-sided kidney stone and possible urinary tract infection for which he can go ahead and start treatment although a urine culture has been sent.  Return to the emergency department immediately for any worsening condition including fever, vomiting and cannot keep medications down, uncontrolled pain, or inability to urinate.  Please collect her stone and turn into primary care office lab or urologist.

## 2017-10-24 LAB — URINE CULTURE

## 2018-03-23 ENCOUNTER — Ambulatory Visit: Payer: BC Managed Care – PPO | Admitting: Advanced Practice Midwife

## 2018-04-10 NOTE — Progress Notes (Deleted)
PCP:  System, Pcp Not In   No chief complaint on file.    HPI:      Ms. Jessica Madden is a 33 y.o. [id number] who LMP was No LMP recorded. Patient has had an injection., presents today for her annual examination.  Her menses are absent due to depo. Dysmenorrhea {dysmen:716}. She {does:18564} have intermenstrual bleeding.  Sex activity: {sex active:315163}.  Last Pap: March 14, 2017  Results were: no abnormalities /neg HPV DNA  Hx of STDs: {STD hx:14358}  There is no FH of breast cancer. There is no FH of ovarian cancer. The patient {does:18564} do self-breast exams.  Tobacco use: {tob:20664} Alcohol use: {Alcohol:11675} No drug use.  Exercise: {exercise:31265}  She {does:18564} get adequate calcium and Vitamin D in her diet.   Past Medical History:  Diagnosis Date  . Abnormal Pap smear of cervix 2007  . Anxiety   . Bacterial vaginosis   . Depression   . Menorrhagia   . Renal disorder    kidney stones    No past surgical history on file.  Family History  Problem Relation Age of Onset  . Diabetes Mother        type 2  . Hypertension Mother   . Diabetes Maternal Grandfather        type 2  . Cancer Maternal Grandfather 50       lung  . Lung cancer Father     Social History   Socioeconomic History  . Marital status: Single    Spouse name: Not on file  . Number of children: 2  . Years of education: 54  . Highest education level: Not on file  Occupational History  . Occupation: Manufacturing engineer: [employer]  Social Needs  . Financial resource strain: Not on file  . Food insecurity:    Worry: Not on file    Inability: Not on file  . Transportation needs:    Medical: Not on file    Non-medical: Not on file  Tobacco Use  . Smoking status: Never Smoker  . Smokeless tobacco: Never Used  Substance and Sexual Activity  . Alcohol use: Not Currently  . Drug use: No  . Sexual activity: Yes    Birth control/protection: Injection  Lifestyle   . Physical activity:    Days per week: Not on file    Minutes per session: Not on file  . Stress: Not on file  Relationships  . Social connections:    Talks on phone: Not on file    Gets together: Not on file    Attends religious service: Not on file    Active member of club or organization: Not on file    Attends meetings of clubs or organizations: Not on file    Relationship status: Not on file  . Intimate partner violence:    Fear of current or ex partner: Not on file    Emotionally abused: Not on file    Physically abused: Not on file    Forced sexual activity: Not on file  Other Topics Concern  . Not on file  Social History Narrative  . Not on file    Outpatient Medications Prior to Visit  Medication Sig Dispense Refill  . cephALEXin (KEFLEX) 500 MG capsule Take 1 capsule (500 mg total) by mouth 2 (two) times daily. 14 capsule 0  . ketorolac (TORADOL) 10 MG tablet Take 1 tablet (10 mg total) by mouth every 8 (eight) hours as  needed for moderate pain. 15 tablet 0  . MedroxyPROGESTERone Acetate 150 MG/ML SUSY Inject 1 mL (150 mg total) into the muscle every 3 (three) months. 0.9 mL 4  . oxyCODONE-acetaminophen (PERCOCET) 5-325 MG tablet Take 1-2 tablets by mouth every 4 (four) hours as needed for severe pain. 10 tablet 0   No facility-administered medications prior to visit.       ROS:  Review of Systems BREAST: No symptoms   Objective: There were no vitals taken for this visit.   OBGyn Exam  Results: No results found for this or any previous visit (from the past 24 hour(s)).  Assessment/Plan: No diagnosis found.  No orders of the defined types were placed in this encounter.            GYN counsel {counseling:16159}     F/U  No follow-ups on file.  Jessica Schiavi B. Pearson Reasons, PA-C 04/10/2018 4:30 PM

## 2018-04-11 ENCOUNTER — Ambulatory Visit: Payer: BC Managed Care – PPO | Admitting: Obstetrics and Gynecology

## 2018-09-28 ENCOUNTER — Ambulatory Visit: Payer: BC Managed Care – PPO | Admitting: Maternal Newborn

## 2018-12-24 ENCOUNTER — Other Ambulatory Visit: Payer: Self-pay

## 2018-12-24 ENCOUNTER — Other Ambulatory Visit: Payer: Self-pay | Admitting: Obstetrics and Gynecology

## 2018-12-24 ENCOUNTER — Ambulatory Visit (INDEPENDENT_AMBULATORY_CARE_PROVIDER_SITE_OTHER): Payer: BC Managed Care – PPO

## 2018-12-24 ENCOUNTER — Encounter: Payer: Self-pay | Admitting: Obstetrics and Gynecology

## 2018-12-24 ENCOUNTER — Ambulatory Visit (INDEPENDENT_AMBULATORY_CARE_PROVIDER_SITE_OTHER): Payer: BC Managed Care – PPO | Admitting: Obstetrics and Gynecology

## 2018-12-24 VITALS — BP 102/68 | Ht 62.5 in | Wt 184.0 lb

## 2018-12-24 DIAGNOSIS — N8311 Corpus luteum cyst of right ovary: Secondary | ICD-10-CM

## 2018-12-24 DIAGNOSIS — Z3A09 9 weeks gestation of pregnancy: Secondary | ICD-10-CM

## 2018-12-24 DIAGNOSIS — Z130 Encounter for screening for diseases of the blood and blood-forming organs and certain disorders involving the immune mechanism: Secondary | ICD-10-CM

## 2018-12-24 DIAGNOSIS — Z3481 Encounter for supervision of other normal pregnancy, first trimester: Secondary | ICD-10-CM

## 2018-12-24 DIAGNOSIS — O3481 Maternal care for other abnormalities of pelvic organs, first trimester: Secondary | ICD-10-CM

## 2018-12-24 DIAGNOSIS — Z3480 Encounter for supervision of other normal pregnancy, unspecified trimester: Secondary | ICD-10-CM | POA: Insufficient documentation

## 2018-12-24 NOTE — Progress Notes (Signed)
12/24/2018   Chief Complaint: Missed period  Transfer of Care Patient: no  History of Present Illness: Ms. Jessica Madden is a 33 y.o. [id number] 5738w1d based on Patient's last menstrual period was 10/21/2018 (exact date). with an Estimated Date of Delivery: 07/28/19, with the above CC.   Her periods were: irregular periods  She was using no method when she conceived.  She has Positive signs or symptoms of nausea/vomiting of pregnancy. She has Negative signs or symptoms of miscarriage or preterm labor She was not taking different medications around the time she conceived/early pregnancy. Since her LMP, she has not used alcohol Since her LMP, she has not used tobacco products Since her LMP, she has not used illegal drugs.    Current or past history of domestic violence. no  Infection History:  1. Since her LMP, she has not had a viral illness.  2. She admits to close contact with children on a regular basis     3. She has a history of chicken pox. She reports vaccination for chicken pox in the past. 4. Patient or partner has history of genital herpes  no 5. History of STI (GC, CT, HPV, syphilis, HIV) - remote hx of chlamydia more than 15 years ago.  6.  She does not live with someone with TB or TB exposed. 7. History of recent travel :  no 8. She identifies Negative Zika risk factors for her and her partner 279. There are not cats in the home in the home.  She understands that while pregnant she should not change cat litter.   Genetic Screening Questions: (Includes patient, baby's father, or anyone in either family)   1. [facility] 31 [facility]  no 2. Thalassemia ([place], [place], Mediterranean, or Asian background): MCV<80 - she or her child might have alpha thalassemia- different FOB  3. Neural tube defect (meningomyelocele, spina bifida, anencephaly)  no 4. Congenital heart defect  no  5. Down syndrome  no 6. Tay-Sachs (Jewish, [place])  no 7. Canavan's Disease  no 8. Sickle  cell disease or trait (African)  No- black  9. Hemophilia or other blood disorders  no  10. Muscular dystrophy  no  11. Cystic fibrosis  no  12. Huntington's Chorea  no  13. Mental retardation/autism  no 14. Other inherited genetic or chromosomal disorder  no 15. Maternal metabolic disorder (DM, PKU, etc)  no 16. Patient or FOB with a child with a birth defect not listed above no  16a. Patient or FOB with a birth defect themselves no 17. Recurrent pregnancy loss, or stillbirth  no  18. Any medications since LMP other than prenatal vitamins (include vitamins, supplements, OTC meds, drugs, alcohol)  no 19. Any other genetic/environmental exposure to discuss  no  ROS:  ROS  OBGYN History: As per HPI. OB History  Gravida Para Term Preterm AB Living  4 2 2  0 1 2  SAB TAB Ectopic Multiple Live Births    1     2    # Outcome Date GA Lbr Len/2nd Weight Sex Delivery Anes PTL Lv  4 Current           3 Term 04/04/08 6667w0d  7 lb 4 oz (3.289 kg) M Vag-Spont   LIV  2 Term 01/21/05 7939w0d  6 lb 7 oz (2.92 kg) M Vag-Spont   LIV  1 TAB             Any issues with any prior pregnancies: vision changes  during second pregnancy and now wears glasses. Any prior children are healthy, doing well, without any problems or issues: yes Last pap smear 2019 nil History of STIs: Yes   Past Medical History: Past Medical History:  Diagnosis Date  . Abnormal Pap smear of cervix 2007  . Anxiety   . Bacterial vaginosis   . Depression   . Menorrhagia   . Renal disorder    kidney stones    Past Surgical History: History reviewed. No pertinent surgical history.  Family History:  Family History  Problem Relation Age of Onset  . Diabetes Mother        type 2  . Hypertension Mother   . Diabetes Maternal Grandfather        type 2  . Cancer Maternal Grandfather 50       lung  . Lung cancer Father    She denies any female cancers, bleeding or blood clotting disorders.   Social History:  Social  History   Socioeconomic History  . Marital status: Single    Spouse name: Not on file  . Number of children: 2  . Years of education: 21  . Highest education level: Not on file  Occupational History  . Occupation: Manufacturing engineer: [employer]  Social Needs  . Financial resource strain: Not on file  . Food insecurity    Worry: Not on file    Inability: Not on file  . Transportation needs    Medical: Not on file    Non-medical: Not on file  Tobacco Use  . Smoking status: Never Smoker  . Smokeless tobacco: Never Used  Substance and Sexual Activity  . Alcohol use: Not Currently  . Drug use: No  . Sexual activity: Yes    Birth control/protection: None  Lifestyle  . Physical activity    Days per week: Not on file    Minutes per session: Not on file  . Stress: Not on file  Relationships  . Social Musician on phone: Not on file    Gets together: Not on file    Attends religious service: Not on file    Active member of club or organization: Not on file    Attends meetings of clubs or organizations: Not on file    Relationship status: Not on file  . Intimate partner violence    Fear of current or ex partner: Not on file    Emotionally abused: Not on file    Physically abused: Not on file    Forced sexual activity: Not on file  Other Topics Concern  . Not on file  Social History Narrative  . Not on file    Allergy: Allergies  Allergen Reactions  . Pineapple Rash    Current Outpatient Medications: No current outpatient medications on file.   Physical Exam: Physical Exam  Constitutional: She is oriented to person, place, and time and well-developed, well-nourished, and in no distress.  HENT:  Head: Normocephalic and atraumatic.  Eyes: Pupils are equal, round, and reactive to light.  Neck: Normal range of motion. Neck supple. No thyromegaly present.  Cardiovascular: Normal rate and regular rhythm.  Pulmonary/Chest: Effort normal.   Abdominal: Soft. Bowel sounds are normal. She exhibits no distension. There is no abdominal tenderness. There is no rebound and no guarding.  Genitourinary:    Genitourinary Comments: External: Normal appearing vulva. No lesions noted.  Bimanual examination: Uterus midline, non-tender, normal in size, shape and contour.  No CMT. No adnexal masses. No adnexal tenderness. Pelvis not fixed.    Musculoskeletal: Normal range of motion.  Neurological: She is alert and oriented to person, place, and time.  Skin: Skin is warm and dry.  Psychiatric: Affect and judgment normal.  Nursing note and vitals reviewed.    Assessment: Jessica Madden is a 33 y.o. [id number] [redacted]w[redacted]d based on Patient's last menstrual period was 10/21/2018 (exact date). with an Estimated Date of Delivery: 07/28/19,  for prenatal care.  Plan:  1) Avoid alcoholic beverages. 2) Patient encouraged not to smoke.  3) Discontinue the use of all non-medicinal drugs and chemicals.  4) Take prenatal vitamins daily.  5) Seatbelt use advised 6) Nutrition, food safety (fish, cheese advisories, and high nitrite foods) and exercise discussed. 7) Hospital and practice style delivering at [facility] discussed  8) Patient is asked about travel to areas at risk for the [place] virus, and counseled to avoid travel and exposure to mosquitoes or sexual partners who may have themselves been exposed to the virus. Testing is discussed, and will be ordered as appropriate.  9) Childbirth classes at [facility] advised 10) Genetic Screening, such as with 1st Trimester Screening, cell free fetal DNA, AFP testing, and Ultrasound, as well as with amniocentesis and CVS as appropriate, is discussed with patient. She plans to have genetic testing this pregnancy.  Discussed hyperemesis- will take OTC B6/B1/unisom first.  Encouraged to initiate prenatal vitamin Interested in [facility] for nausea at work- will bring papers.  Pending CBC  MCV may need further testing for alpha-thalassemia as  this is not detected with hgb electrophoresis   Problem list reviewed and updated.  I discussed the assessment and treatment plan with the patient. The patient was provided an opportunity to ask questions and all were answered. The patient agreed with the plan and demonstrated an understanding of the instructions.  Adrian Prows MD [facility], [facility] 12/24/2018 9:19 AM

## 2018-12-24 NOTE — Assessment & Plan Note (Signed)
  Nursing Staff Provider  Office Location  [facility] Dating    Language  English Anatomy US    Flu Vaccine   Genetic Screen  NIPS:   AFP:   First Screen:    TDaP vaccine    Hgb A1C or  GTT Early : Third trimester :   Rhogam     LAB RESULTS   Feeding Plan  Blood Type     Contraception  Antibody    Circumcision  Rubella    Pediatrician   RPR     Support Person  HBsAg     Prenatal Classes  HIV      Varicella @[email or web address] @   BTL Consent  GBS  (For PCN allergy, check sensitivities)        VBAC Consent  Pap      Hgb Electro      CF      SMA

## 2018-12-24 NOTE — Patient Instructions (Addendum)
Initial steps to help :   B6 (pyridoxine) 25 mg,  3-4 times a day Unisom (doxylamine) 25 mg at bedtime **B6 and Unisom are available as a combination prescription medications called diclegis and bonjesta  B1 (thiamin)  50-100 mg 1-4 a day  Continue prenatal vitamin with iron and thiamin. If it is not tolerated switch to 1 mg of folic acid.  Can add medication for gastric reflux if needed.  Subsequent steps to be added to B1, B6, and Unisom:  1. Antihistamine (one of the following medications) Dramamine      25-50 mg every 4-6 hours Benadryl      25-50 mg every 4-6 hours Meclizine      25 mg every 6 hours  2. Dopamine Antagonist (one of the following medications) Metoclopramide  (Reglan)  5-10 mg every 6-8 hours         PO Promethazine   (Phenergan)   12.5-25 mg every 4-6 hours      PO or rectal Prochlorperazine  (Compazine)  5-10 mg every 6-8 hours     25mg BID rectally    Subsequent steps if there has still not been improvement in symptoms:  3. Daily stool softner  4. Ondansetron  (Zofran)   4-8 mg every 6-8 hours   

## 2018-12-24 NOTE — Progress Notes (Signed)
NOB C/o nausea/vomiting, and HA's Denies vb, cramping Flu shot in October at work

## 2018-12-25 LAB — MONITOR DRUG PROFILE 10(MW)
Amphetamine Scrn, Ur: NEGATIVE ng/mL
BARBITURATE SCREEN URINE: NEGATIVE ng/mL
BENZODIAZEPINE SCREEN, URINE: NEGATIVE ng/mL
CANNABINOIDS UR QL SCN: NEGATIVE ng/mL
Cocaine (Metab) Scrn, Ur: NEGATIVE ng/mL
Creatinine(Crt), U: 172.8 mg/dL (ref 20.0–300.0)
Methadone Screen, Urine: NEGATIVE ng/mL
OXYCODONE+OXYMORPHONE UR QL SCN: NEGATIVE ng/mL
Opiate Scrn, Ur: NEGATIVE ng/mL
Ph of Urine: 6.6 (ref 4.5–8.9)
Phencyclidine Qn, Ur: NEGATIVE ng/mL
Propoxyphene Scrn, Ur: NEGATIVE ng/mL

## 2018-12-26 ENCOUNTER — Other Ambulatory Visit: Payer: Self-pay | Admitting: Obstetrics and Gynecology

## 2018-12-26 DIAGNOSIS — N3000 Acute cystitis without hematuria: Secondary | ICD-10-CM

## 2018-12-26 LAB — RPR+RH+ABO+RUB AB+AB SCR+CB...
Antibody Screen: NEGATIVE
HIV Screen 4th Generation wRfx: NONREACTIVE
Hematocrit: 40.5 % (ref 34.0–46.6)
Hemoglobin: 13.2 g/dL (ref 11.1–15.9)
Hepatitis B Surface Ag: NEGATIVE
MCH: 28.4 pg (ref 26.6–33.0)
MCHC: 32.6 g/dL (ref 31.5–35.7)
MCV: 87 fL (ref 79–97)
Platelets: 326 10*3/uL (ref 150–450)
RBC: 4.65 x10E6/uL (ref 3.77–5.28)
RDW: 12.3 % (ref 11.7–15.4)
RPR Ser Ql: NONREACTIVE
Rh Factor: POSITIVE
Rubella Antibodies, IGG: 7.6 index (ref >0.99)
Varicella zoster IgG: 151 index — ABNORMAL LOW (ref >165)
WBC: 10.8 10*3/uL (ref 3.4–10.8)

## 2018-12-26 LAB — URINE CULTURE

## 2018-12-26 LAB — HEMOGLOBINOPATHY EVALUATION
HGB C: 0 %
HGB S: 0 %
HGB VARIANT: 0 %
Hemoglobin A2 Quantitation: 2.3 % (ref 1.8–3.2)
Hemoglobin F Quantitation: 0 % (ref 0.0–2.0)
Hgb A: 97.7 % (ref 96.4–98.8)

## 2018-12-26 MED ORDER — AMOXICILLIN 500 MG PO CAPS: 500.0000 mg | ORAL_CAPSULE | Freq: Three times a day (TID) | ORAL | 0 refills | Status: AC

## 2018-12-26 NOTE — Progress Notes (Signed)
Called, did not answer- sent note to mychart and rx to pharmacy for UTI.

## 2018-12-27 ENCOUNTER — Other Ambulatory Visit: Payer: Self-pay | Admitting: Obstetrics and Gynecology

## 2018-12-27 DIAGNOSIS — Z3480 Encounter for supervision of other normal pregnancy, unspecified trimester: Secondary | ICD-10-CM

## 2018-12-27 LAB — NUSWAB VAGINITIS PLUS (VG+)
Candida albicans, NAA: NEGATIVE
Candida glabrata, NAA: NEGATIVE
Chlamydia trachomatis, NAA: NEGATIVE
Neisseria gonorrhoeae, NAA: NEGATIVE
Trich vag by NAA: NEGATIVE

## 2018-12-27 MED ORDER — VITAFOL-NANO 18-0.6-0.4 MG PO TABS: 1.0000 | ORAL_TABLET | Freq: Every day | ORAL | 11 refills | Status: DC

## 2019-01-02 NOTE — Progress Notes (Signed)
Called and discussed with patient. She reported that she has not been taking amoxicillin because she felt weird on it and it gave her urine a strong odor. Discussed the reason for the antibiotics and she was agreeable to completing the antibiotic.

## 2019-01-14 ENCOUNTER — Encounter: Payer: BC Managed Care – PPO | Admitting: Obstetrics and Gynecology

## 2019-01-14 ENCOUNTER — Telehealth: Payer: Self-pay | Admitting: Obstetrics and Gynecology

## 2019-01-14 NOTE — Telephone Encounter (Signed)
Patient was schedule today for office visit reschedule to 01/30/19 due to emergency at the hospital with a provider. Patient is requesting Diflucan due to symptoms of yeast infection. Please advise

## 2019-01-14 NOTE — Telephone Encounter (Signed)
Pt states she has tried Monistat and it has not helped at all, please advise

## 2019-01-14 NOTE — Telephone Encounter (Signed)
It would be better for her to use an over-the-counter medication this early in her pregnancy.  Diflucan is supposed to be second line at any point in the pregnancy.

## 2019-01-23 ENCOUNTER — Other Ambulatory Visit: Payer: Self-pay | Admitting: Obstetrics and Gynecology

## 2019-01-23 DIAGNOSIS — B3731 Acute candidiasis of vulva and vagina: Secondary | ICD-10-CM

## 2019-01-23 DIAGNOSIS — Z3480 Encounter for supervision of other normal pregnancy, unspecified trimester: Secondary | ICD-10-CM

## 2019-01-23 DIAGNOSIS — B373 Candidiasis of vulva and vagina: Secondary | ICD-10-CM

## 2019-01-23 MED ORDER — FLUCONAZOLE 150 MG PO TABS: 150.0000 mg | ORAL_TABLET | Freq: Once | ORAL | 0 refills | Status: AC

## 2019-01-23 NOTE — Telephone Encounter (Signed)
Pt aware via vm 

## 2019-01-23 NOTE — Telephone Encounter (Signed)
So, this sort of got lost in the shuffle of a punch of requests. I sent in the [place] in for her and I'm very sorry. I have never met this patient. So, i'm not sure how this wound up in my lap. But, please just let her know that I'm sorry that it took so long.  That is not typical for [place]. thanks

## 2019-01-30 ENCOUNTER — Ambulatory Visit (INDEPENDENT_AMBULATORY_CARE_PROVIDER_SITE_OTHER): Payer: BC Managed Care – PPO | Admitting: Advanced Practice Midwife

## 2019-01-30 ENCOUNTER — Other Ambulatory Visit: Payer: Self-pay

## 2019-01-30 ENCOUNTER — Encounter: Payer: Self-pay | Admitting: Advanced Practice Midwife

## 2019-01-30 VITALS — BP 120/70 | Wt 190.0 lb

## 2019-01-30 DIAGNOSIS — Z3482 Encounter for supervision of other normal pregnancy, second trimester: Secondary | ICD-10-CM

## 2019-01-30 DIAGNOSIS — Z3A14 14 weeks gestation of pregnancy: Secondary | ICD-10-CM

## 2019-01-30 LAB — POCT URINALYSIS DIPSTICK OB

## 2019-01-30 NOTE — Progress Notes (Signed)
  Routine Prenatal Care Visit  Subjective  Jessica Madden is a 33 y.o. [phone] at [redacted]w[redacted]d being seen today for ongoing prenatal care.  She is currently monitored for the following issues for this low-risk pregnancy and has Supervision of other normal pregnancy, antepartum on their problem list.  ----------------------------------------------------------------------------------- Patient reports nausea improving, no vomiting. She is transferring care to [facility]. She did not want to discuss reason.    . Vag. Bleeding: None.   . Leaking Fluid denies.  ----------------------------------------------------------------------------------- The following portions of the patient's history were reviewed and updated as appropriate: allergies, current medications, past family history, past medical history, past social history, past surgical history and problem list. Problem list updated.  Objective  Blood pressure 120/70, weight 190 lb (86.2 kg), last menstrual period 10/21/2018. Pregravid weight 184 lb (83.5 kg) Total Weight Gain 6 lb (2.722 kg) Urinalysis: Urine Protein    Urine Glucose    Fetal Status: Fetal Heart Rate (bpm): 154         General:  Alert, oriented and cooperative. Patient is in no acute distress.  Skin: Skin is warm and dry. No rash noted.   Cardiovascular: Normal heart rate noted  Respiratory: Normal respiratory effort, no problems with respiration noted  Abdomen: Soft, gravid, appropriate for gestational age. Pain/Pressure: Absent     Pelvic:  Cervical exam deferred        Extremities: Normal range of motion.     Mental Status: Normal mood and affect. Normal behavior. Normal judgment and thought content.   Assessment   33 y.o. [id number] at [redacted]w[redacted]d by  07/28/2019, by Last Menstrual Period presenting for routine prenatal visit  Plan   Pregnancy #4 Problems (from 10/21/18 to present)    Problem Noted Resolved   Supervision of other normal pregnancy, antepartum 12/24/2018 by Homero Fellers, MD No       Preterm labor symptoms and general obstetric precautions including but not limited to vaginal bleeding, contractions, leaking of fluid and fetal movement were reviewed in detail with the patient. Please refer to After Visit Summary for other counseling recommendations.   Return for transferring care to [facility].  Rod Can, CNM 01/30/2019 9:20 AM

## 2019-01-30 NOTE — Patient Instructions (Signed)

## 2019-01-30 NOTE — Addendum Note (Signed)
Addended by: Quintella Baton D on: 01/30/2019 10:52 AM   Modules accepted: Orders

## 2019-01-31 IMAGING — CT CT RENAL STONE PROTOCOL
2 of 6 series · 6 of 46 positions shown, 8 images · non-contrast
Comparison: 07/09/2017

CLINICAL DATA: Right flank pain since [REDACTED]

EXAM:
CT ABDOMEN AND PELVIS WITHOUT CONTRAST
TECHNIQUE: Multidetector CT imaging of the abdomen and pelvis was performed
following the standard protocol without IV contrast.

[Series 6: coronal · coronal · 0.66mm/px · 3 of 119 slices shown, 4 images (1 of 2)]
[im 30/119  soft-tissue]
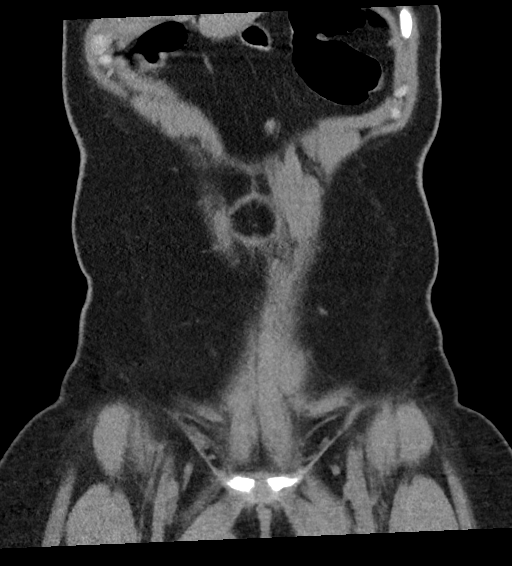
[im 30/119  bone]
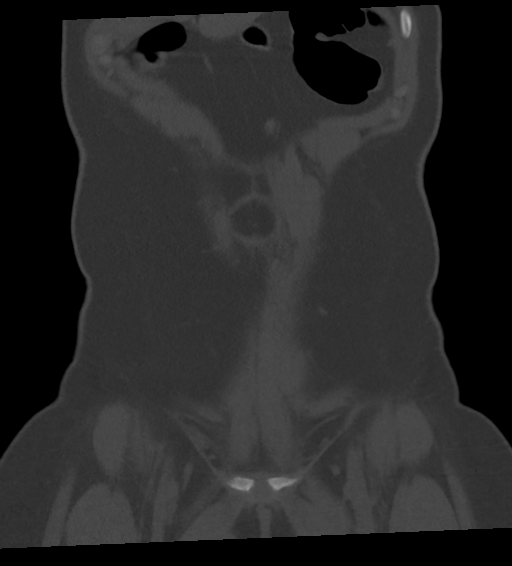
[im 60/119  soft-tissue]
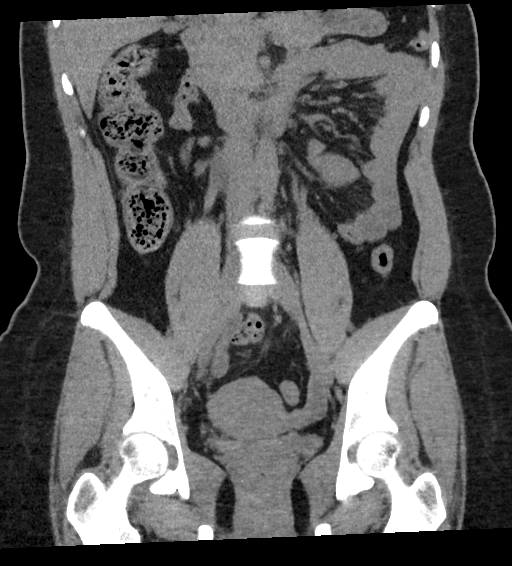
[im 89/119  soft-tissue]
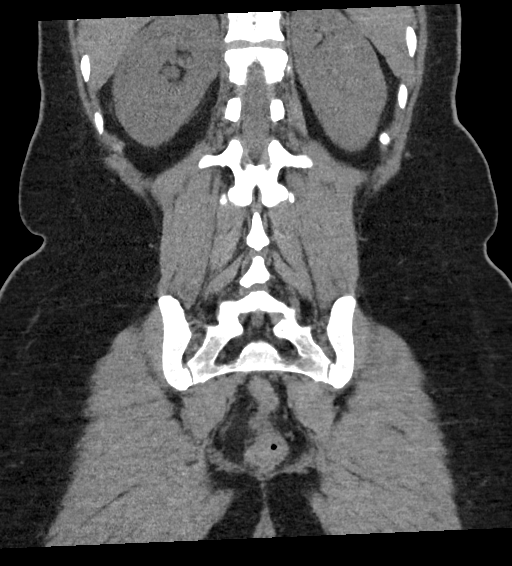

[Series 9: coronal · coronal · 0.25mm/px · 3 of 133 slices shown, 4 images (2 of 2)]
[im 34/133  soft-tissue]
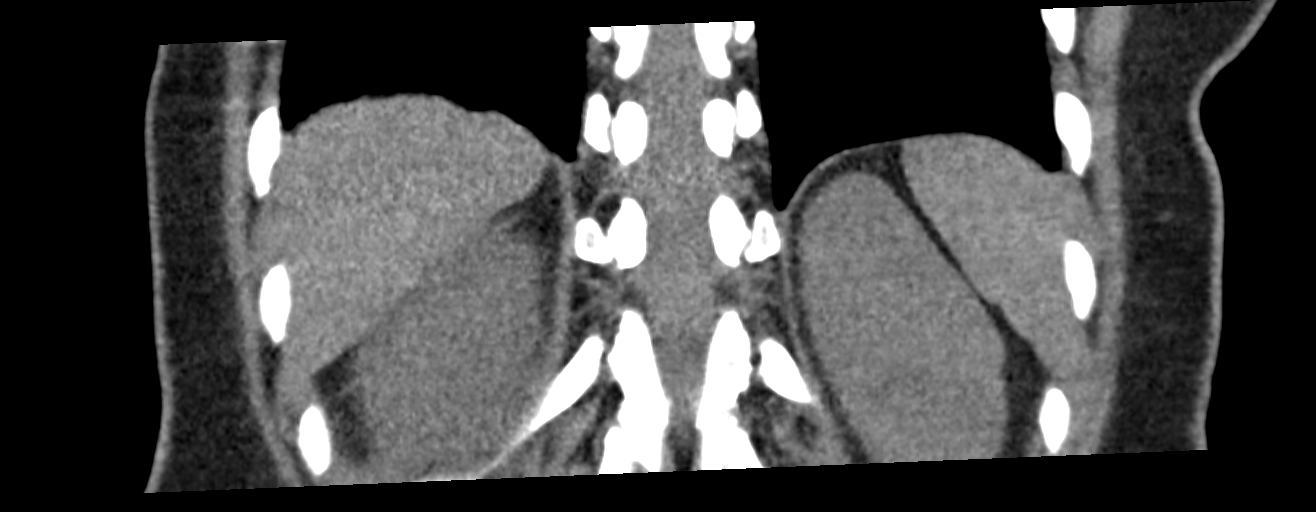
[im 67/133  soft-tissue]
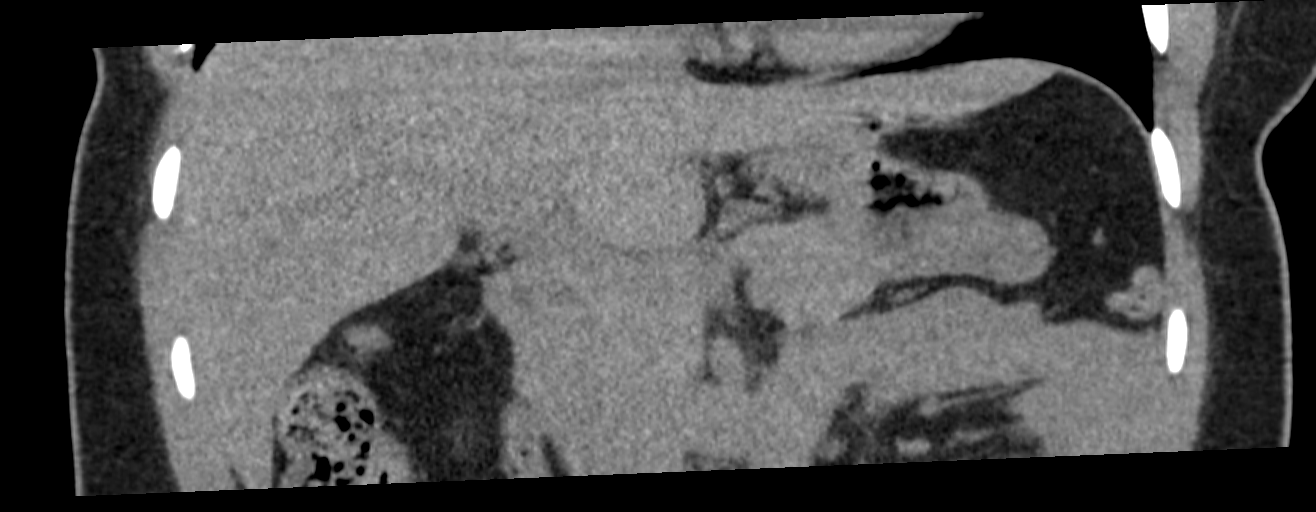
[im 67/133  bone]
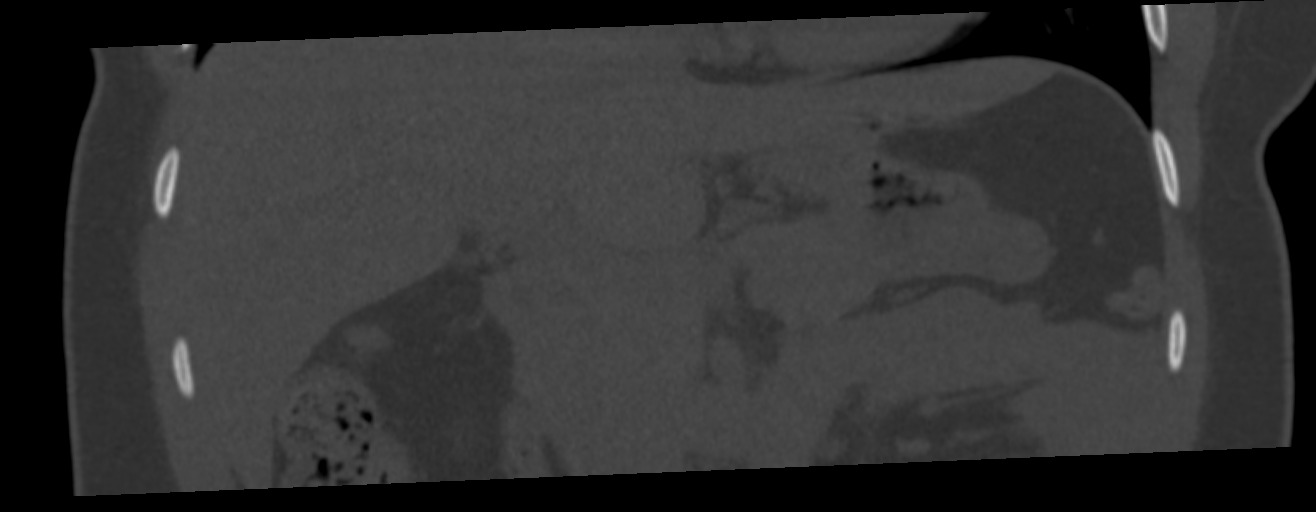
[im 100/133  soft-tissue]
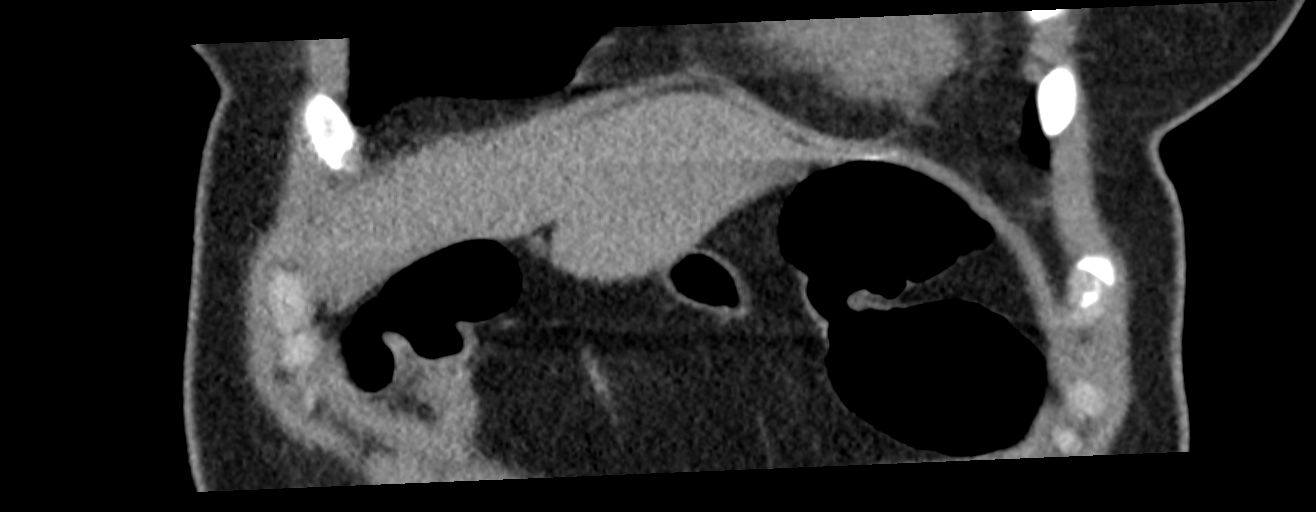

[6 of 46 positions shown; findings below may reference images not displayed]

FINDINGS: Lower chest:  No contributory findings.

Hepatobiliary: No focal liver abnormality.No evidence of biliary
obstruction or stone.

Pancreas: Unremarkable.

Spleen: Unremarkable.

Adrenals/Urinary Tract: Negative adrenals. Right
hydroureteronephrosis secondary to a 2 mm stone at the right UVJ.
Two left and single right lower pole calculi measuring up to 5 mm on
the left. Decompressed bladder.

Stomach/Bowel:  No obstruction. No appendicitis.

Vascular/Lymphatic: No acute vascular abnormality. No mass or
adenopathy.

Reproductive:No pathologic findings.

Other: No ascites or pneumoperitoneum.

Musculoskeletal: No acute abnormalities.
IMPRESSION: 1. Obstructing 2 mm right UVJ calculus.
2. Bilateral nephrolithiasis.

## 2021-06-12 ENCOUNTER — Inpatient Hospital Stay
Admission: EM | Admit: 2021-06-12 | Discharge: 2021-06-14 | DRG: 660 | Disposition: A | Discharge status: Home / Self Care | Payer: BC Managed Care – PPO | Attending: Internal Medicine | Admitting: Internal Medicine

## 2021-06-12 ENCOUNTER — Inpatient Hospital Stay: Payer: BC Managed Care – PPO | Admitting: Anesthesiology

## 2021-06-12 ENCOUNTER — Inpatient Hospital Stay: Payer: BC Managed Care – PPO

## 2021-06-12 ENCOUNTER — Encounter: Payer: Self-pay | Admitting: Emergency Medicine

## 2021-06-12 ENCOUNTER — Encounter
Admission: EM | Disposition: A | Discharge status: Home / Self Care | Payer: Self-pay | Source: Home / Self Care | Attending: Internal Medicine

## 2021-06-12 ENCOUNTER — Other Ambulatory Visit: Payer: Self-pay

## 2021-06-12 ENCOUNTER — Emergency Department: Payer: BC Managed Care – PPO

## 2021-06-12 DIAGNOSIS — R109 Unspecified abdominal pain: Secondary | ICD-10-CM | POA: Diagnosis present

## 2021-06-12 DIAGNOSIS — N202 Calculus of kidney with calculus of ureter: Secondary | ICD-10-CM

## 2021-06-12 DIAGNOSIS — N3030 Trigonitis without hematuria: Secondary | ICD-10-CM | POA: Diagnosis present

## 2021-06-12 DIAGNOSIS — Z87440 Personal history of urinary (tract) infections: Secondary | ICD-10-CM

## 2021-06-12 DIAGNOSIS — N136 Pyonephrosis: Secondary | ICD-10-CM | POA: Diagnosis present

## 2021-06-12 DIAGNOSIS — N132 Hydronephrosis with renal and ureteral calculous obstruction: Secondary | ICD-10-CM | POA: Diagnosis present

## 2021-06-12 DIAGNOSIS — Z91018 Allergy to other foods: Secondary | ICD-10-CM | POA: Diagnosis not present

## 2021-06-12 DIAGNOSIS — R3129 Other microscopic hematuria: Secondary | ICD-10-CM

## 2021-06-12 DIAGNOSIS — N200 Calculus of kidney: Secondary | ICD-10-CM | POA: Diagnosis present

## 2021-06-12 DIAGNOSIS — Z793 Long term (current) use of hormonal contraceptives: Secondary | ICD-10-CM

## 2021-06-12 DIAGNOSIS — Z87442 Personal history of urinary calculi: Secondary | ICD-10-CM

## 2021-06-12 DIAGNOSIS — R55 Syncope and collapse: Secondary | ICD-10-CM

## 2021-06-12 DIAGNOSIS — N390 Urinary tract infection, site not specified: Secondary | ICD-10-CM

## 2021-06-12 DIAGNOSIS — F32A Depression, unspecified: Secondary | ICD-10-CM | POA: Diagnosis present

## 2021-06-12 DIAGNOSIS — N138 Other obstructive and reflux uropathy: Secondary | ICD-10-CM

## 2021-06-12 DIAGNOSIS — Q6239 Other obstructive defects of renal pelvis and ureter: Secondary | ICD-10-CM

## 2021-06-12 DIAGNOSIS — R8281 Pyuria: Secondary | ICD-10-CM | POA: Diagnosis not present

## 2021-06-12 HISTORY — PX: CYSTOSCOPY W/ URETERAL STENT PLACEMENT: SHX1429

## 2021-06-12 LAB — CBC
HCT: 38.8 % (ref 36.0–46.0)
Hemoglobin: 12.7 g/dL (ref 12.0–15.0)
MCH: 27.4 pg (ref 26.0–34.0)
MCHC: 32.7 g/dL (ref 30.0–36.0)
MCV: 83.6 fL (ref 80.0–100.0)
Platelets: 291 10*3/uL (ref 150–400)
RBC: 4.64 MIL/uL (ref 3.87–5.11)
RDW: 12.9 % (ref 11.5–15.5)
WBC: 8.3 10*3/uL (ref 4.0–10.5)
nRBC: 0 % (ref 0.0–0.2)

## 2021-06-12 LAB — URINALYSIS, ROUTINE W REFLEX MICROSCOPIC
Bilirubin Urine: NEGATIVE
Glucose, UA: NEGATIVE mg/dL
Ketones, ur: NEGATIVE mg/dL
Nitrite: NEGATIVE
Protein, ur: NEGATIVE mg/dL
Specific Gravity, Urine: 1.018 (ref 1.005–1.030)
pH: 8 (ref 5.0–8.0)

## 2021-06-12 LAB — BASIC METABOLIC PANEL
Anion gap: 7 (ref 5–15)
BUN: 11 mg/dL (ref 6–20)
CO2: 24 mmol/L (ref 22–32)
Calcium: 9.5 mg/dL (ref 8.9–10.3)
Chloride: 109 mmol/L (ref 98–111)
Creatinine, Ser: 0.89 mg/dL (ref 0.44–1.00)
GFR, Estimated: >60 mL/min (ref >60)
Glucose, Bld: 152 mg/dL — ABNORMAL HIGH (ref 70–99)
Potassium: 3.8 mmol/L (ref 3.5–5.1)
Sodium: 140 mmol/L (ref 135–145)

## 2021-06-12 LAB — POC URINE PREG, ED: Preg Test, Ur: NEGATIVE

## 2021-06-12 SURGERY — CYSTOSCOPY, WITH RETROGRADE PYELOGRAM AND URETERAL STENT INSERTION
Anesthesia: General | Site: Ureter | Laterality: Left

## 2021-06-12 MED ORDER — ACETAMINOPHEN 10 MG/ML IV SOLN: 1000.0000 mg | Freq: Once | INTRAVENOUS | Status: DC | PRN

## 2021-06-12 MED ORDER — IOHEXOL 180 MG/ML  SOLN
INTRAMUSCULAR | Status: DC | PRN
  Administered 2021-06-12: 10 mL

## 2021-06-12 MED ORDER — POLYETHYLENE GLYCOL 3350 17 G PO PACK: 17.0000 g | PACK | Freq: Every day | ORAL | Status: DC | PRN

## 2021-06-12 MED ORDER — OXYCODONE HCL 5 MG PO TABS: 5.0000 mg | ORAL_TABLET | Freq: Once | ORAL | Status: DC | PRN

## 2021-06-12 MED ORDER — ACETAMINOPHEN 500 MG PO TABS
1000.0000 mg | ORAL_TABLET | Freq: Four times a day (QID) | ORAL | Status: DC | PRN
  Administered 2021-06-12 – 2021-06-14 (×4): 1000 mg via ORAL
  Filled 2021-06-12 (×4): qty 2

## 2021-06-12 MED ORDER — SODIUM CHLORIDE 0.9 % IV SOLN
1.0000 g | Freq: Once | INTRAVENOUS | Status: AC
  Administered 2021-06-12: 1 g via INTRAVENOUS
  Filled 2021-06-12 (×2): qty 10

## 2021-06-12 MED ORDER — HYDROMORPHONE HCL 1 MG/ML IJ SOLN
1.0000 mg | Freq: Once | INTRAMUSCULAR | Status: AC
  Administered 2021-06-12: 1 mg via INTRAVENOUS
  Filled 2021-06-12: qty 1

## 2021-06-12 MED ORDER — ONDANSETRON HCL 4 MG/2ML IJ SOLN
4.0000 mg | Freq: Once | INTRAMUSCULAR | Status: AC
  Administered 2021-06-12: 4 mg via INTRAVENOUS
  Filled 2021-06-12: qty 2

## 2021-06-12 MED ORDER — OXYCODONE HCL 5 MG/5ML PO SOLN: 5.0000 mg | Freq: Once | ORAL | Status: DC | PRN

## 2021-06-12 MED ORDER — MORPHINE SULFATE (PF) 4 MG/ML IV SOLN
4.0000 mg | Freq: Once | INTRAVENOUS | Status: AC
  Administered 2021-06-12: 4 mg via INTRAVENOUS
  Filled 2021-06-12: qty 1

## 2021-06-12 MED ORDER — ONDANSETRON HCL 4 MG/2ML IJ SOLN: 4.0000 mg | Freq: Four times a day (QID) | INTRAMUSCULAR | Status: DC | PRN

## 2021-06-12 MED ORDER — FENTANYL CITRATE (PF) 100 MCG/2ML IJ SOLN
INTRAMUSCULAR | Status: AC
  Filled 2021-06-12: qty 2

## 2021-06-12 MED ORDER — SODIUM CHLORIDE 0.9 % IR SOLN
Status: DC | PRN
  Administered 2021-06-12: 3000 mL

## 2021-06-12 MED ORDER — KETOROLAC TROMETHAMINE 30 MG/ML IJ SOLN
INTRAMUSCULAR | Status: DC | PRN
  Administered 2021-06-12: 30 mg via INTRAVENOUS

## 2021-06-12 MED ORDER — KETOROLAC TROMETHAMINE 15 MG/ML IJ SOLN
15.0000 mg | Freq: Once | INTRAMUSCULAR | Status: AC
  Administered 2021-06-12: 15 mg via INTRAVENOUS
  Filled 2021-06-12: qty 1

## 2021-06-12 MED ORDER — MORPHINE SULFATE (PF) 2 MG/ML IV SOLN: 2.0000 mg | INTRAVENOUS | Status: AC | PRN

## 2021-06-12 MED ORDER — ONDANSETRON HCL 4 MG PO TABS: 4.0000 mg | ORAL_TABLET | Freq: Four times a day (QID) | ORAL | Status: DC | PRN

## 2021-06-12 MED ORDER — ACETAMINOPHEN 650 MG RE SUPP: 650.0000 mg | Freq: Four times a day (QID) | RECTAL | Status: DC | PRN

## 2021-06-12 MED ORDER — MIDAZOLAM HCL 2 MG/2ML IJ SOLN
INTRAMUSCULAR | Status: AC
  Filled 2021-06-12: qty 2

## 2021-06-12 MED ORDER — ONDANSETRON HCL 4 MG/2ML IJ SOLN
INTRAMUSCULAR | Status: DC | PRN
Start: 2021-06-12 — End: 2021-06-12
  Administered 2021-06-12: 4 mg via INTRAVENOUS

## 2021-06-12 MED ORDER — LIDOCAINE HCL (CARDIAC) PF 100 MG/5ML IV SOSY
PREFILLED_SYRINGE | INTRAVENOUS | Status: DC | PRN
  Administered 2021-06-12: 80 mg via INTRAVENOUS

## 2021-06-12 MED ORDER — MIDAZOLAM HCL 2 MG/2ML IJ SOLN
INTRAMUSCULAR | Status: DC | PRN
  Administered 2021-06-12: 2 mg via INTRAVENOUS

## 2021-06-12 MED ORDER — HYDROMORPHONE HCL 1 MG/ML IJ SOLN: 1.0000 mg | INTRAMUSCULAR | Status: AC | PRN

## 2021-06-12 MED ORDER — FENTANYL CITRATE (PF) 100 MCG/2ML IJ SOLN
INTRAMUSCULAR | Status: DC | PRN
  Administered 2021-06-12: 50 ug via INTRAVENOUS

## 2021-06-12 MED ORDER — HYDROMORPHONE HCL 1 MG/ML IJ SOLN: 0.5000 mg | INTRAMUSCULAR | Status: DC | PRN

## 2021-06-12 MED ORDER — FENTANYL CITRATE (PF) 100 MCG/2ML IJ SOLN
25.0000 ug | INTRAMUSCULAR | Status: DC | PRN
  Administered 2021-06-12: 50 ug via INTRAVENOUS

## 2021-06-12 MED ORDER — DROPERIDOL 2.5 MG/ML IJ SOLN: 0.6250 mg | Freq: Once | INTRAMUSCULAR | Status: DC | PRN

## 2021-06-12 MED ORDER — LACTATED RINGERS IV SOLN
INTRAVENOUS | Status: DC | PRN
Start: 2021-06-12 — End: 2021-06-12

## 2021-06-12 MED ORDER — PROPOFOL 10 MG/ML IV BOLUS
INTRAVENOUS | Status: DC | PRN
  Administered 2021-06-12: 150 mg via INTRAVENOUS

## 2021-06-12 MED ORDER — ACETAMINOPHEN 500 MG PO TABS
1000.0000 mg | ORAL_TABLET | Freq: Once | ORAL | Status: AC
  Administered 2021-06-12: 1000 mg via ORAL
  Filled 2021-06-12: qty 2

## 2021-06-12 MED ORDER — PROMETHAZINE HCL 25 MG/ML IJ SOLN: 6.2500 mg | INTRAMUSCULAR | Status: DC | PRN

## 2021-06-12 MED ORDER — ONDANSETRON HCL 4 MG/2ML IJ SOLN: 4.0000 mg | Freq: Three times a day (TID) | INTRAMUSCULAR | Status: DC | PRN

## 2021-06-12 MED ORDER — DEXAMETHASONE SODIUM PHOSPHATE 10 MG/ML IJ SOLN
INTRAMUSCULAR | Status: DC | PRN
Start: 2021-06-12 — End: 2021-06-12
  Administered 2021-06-12: 8 mg via INTRAVENOUS

## 2021-06-12 MED ORDER — FENTANYL CITRATE (PF) 100 MCG/2ML IJ SOLN
INTRAMUSCULAR | Status: AC
  Administered 2021-06-12: 50 ug via INTRAVENOUS
  Filled 2021-06-12: qty 2

## 2021-06-12 MED ORDER — SODIUM CHLORIDE 0.9 % IV SOLN
INTRAVENOUS | Status: DC
Start: 2021-06-12 — End: 2021-06-12

## 2021-06-12 MED ORDER — SODIUM CHLORIDE 0.9 % IV SOLN
1.0000 g | INTRAVENOUS | Status: DC
  Administered 2021-06-13 – 2021-06-14 (×2): 1 g via INTRAVENOUS
  Filled 2021-06-12 (×2): qty 1

## 2021-06-12 MED ORDER — SODIUM CHLORIDE 0.9 % IV SOLN: INTRAVENOUS | Status: AC

## 2021-06-12 SURGICAL SUPPLY — 19 items
BAG DRAIN CYSTO-URO LG1000N (MISCELLANEOUS) ×2 IMPLANT
CATH URETL 5X70 OPEN END (CATHETERS) ×1 IMPLANT
CATH URETL OPEN 5X70 (CATHETERS) ×2 IMPLANT
GAUZE 4X4 16PLY ~~LOC~~+RFID DBL (SPONGE) ×4 IMPLANT
GLOVE BIO SURGEON STRL SZ8 (GLOVE) ×2 IMPLANT
GOWN STRL REUS W/ TWL LRG LVL4 (GOWN DISPOSABLE) ×1 IMPLANT
GOWN STRL REUS W/ TWL XL LVL3 (GOWN DISPOSABLE) ×1 IMPLANT
GOWN STRL REUS W/TWL LRG LVL4 (GOWN DISPOSABLE) ×1
GOWN STRL REUS W/TWL XL LVL3 (GOWN DISPOSABLE) ×1
GUIDEWIRE STR DUAL SENSOR (WIRE) ×4 IMPLANT
IV NS IRRIG 3000ML ARTHROMATIC (IV SOLUTION) ×2 IMPLANT
KIT TURNOVER CYSTO (KITS) ×2 IMPLANT
MANIFOLD NEPTUNE II (INSTRUMENTS) ×2 IMPLANT
PACK CYSTO AR (MISCELLANEOUS) ×2 IMPLANT
SET CYSTO W/LG BORE CLAMP LF (SET/KITS/TRAYS/PACK) ×2 IMPLANT
STENT URET 6FRX24 CONTOUR (STENTS) IMPLANT
STENT URET 6FRX26 CONTOUR (STENTS) ×1 IMPLANT
WATER STERILE IRR 1000ML POUR (IV SOLUTION) ×2 IMPLANT
WATER STERILE IRR 500ML POUR (IV SOLUTION) ×2 IMPLANT

## 2021-06-12 NOTE — ED Triage Notes (Signed)
Pt presents via POV with complaints of left sided flank pain starting a few hours ago.  Hx of kidney stones. Pt tearful in triage in visible discomfort. EDP notified - see MAR for intervention.  ?

## 2021-06-12 NOTE — Assessment & Plan Note (Signed)
-   Patient was prescribed venlafaxine XR 75 mg daily, patient states she is no longer taking this medication, this has not been resumed ?

## 2021-06-12 NOTE — Consult Note (Signed)
?Subjective: ?1. Kidney stone   ?2. Acute UTI   ?3. Syncope, unspecified syncope type   ?  ? ?Consult requested by Dr. Judd Gaudier. ? ? ?Jessica Madden is a 36 yo female with a history of recurrent urolithiasis who began having some mild left flank pain about a week ago but had the onset last night of severe left flank pain with nausea and vomiting that has required multiple doses of narcotic to control.  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  She has prior episodes of pain and has passed stones.  She had a right ureteral stone in 2019.  More recently she has been treated for asymptomatic UTI's by her PCP with 2 rounds of macrobid and more recently cipro.  She has no hematuria or voiding symptoms and her UA today has RBC's and WBC's but also epis suggesting a vaginal contaminant.   She has no other GU history.   She has had 3 UCx this year with [facility] with mixed flora.  ?ROS: ? ?Review of Systems  ?Constitutional:  Positive for chills. Negative for fever.  ?Gastrointestinal:  Positive for nausea and vomiting.  ?Genitourinary:  Positive for flank pain.  ?All other systems reviewed and are negative. ? ?Allergies  ?Allergen Reactions  ? Pineapple Rash  ? ? ?Past Medical History:  ?Diagnosis Date  ? Abnormal Pap smear of cervix 2007  ? Anxiety   ? Bacterial vaginosis   ? Depression   ? Menorrhagia   ? Renal disorder   ? kidney stones  ? ? ?History reviewed. No pertinent surgical history. ? ?Social History  ? ?Socioeconomic History  ? Marital status: Single  ?  Spouse name: Not on file  ? Number of children: 2  ? Years of education: 35  ? Highest education level: Not on file  ?Occupational History  ? Occupation: Psychologist, sport and exercise  ?  Employer: [employer]  ?Tobacco Use  ? Smoking status: Never  ? Smokeless tobacco: Never  ?Vaping Use  ? Vaping Use:  Never used  ?Substance and Sexual Activity  ? Alcohol use: Not Currently  ? Drug use: No  ? Sexual activity: Yes  ?  Birth control/protection: None  ?Other Topics Concern  ? Not on file  ?Social History Narrative  ? Not on file  ? ?Social Determinants of Health  ? ?Financial Resource Strain: Not on file  ?Food Insecurity: Not on file  ?Transportation Needs: Not on file  ?Physical Activity: Not on file  ?Stress: Not on file  ?Social Connections: Not on file  ?Intimate Partner Violence: Not on file  ? ? ?Family History  ?Problem Relation Age of Onset  ? Diabetes Mother   ?     type 2  ? Hypertension Mother   ? Diabetes Maternal Grandfather   ?     type 2  ? Cancer Maternal Grandfather 30  ?     lung  ? Lung cancer Father   ? ? ?Anti-infectives: ?Anti-infectives (From admission, onward)  ? ? Start     Dose/Rate Route Frequency Ordered Stop  ? 06/13/21 0700  cefTRIAXone (ROCEPHIN) 1 g in sodium chloride 0.9 % 100 mL IVPB       ? 1 g ?200 mL/hr over 30 Minutes Intravenous Every 24 hours 06/12/21 0744 06/19/21  [id number]  ? 06/12/21 0615  cefTRIAXone (ROCEPHIN) 1 g in sodium chloride 0.9 % 100 mL IVPB       ? 1 g ?200 mL/hr over 30 Minutes Intravenous  Once 06/12/21 [id number]    ? ?  ? ? ?Current Facility-Administered Medications  ?Medication Dose Route Frequency Provider Last Rate Last Admin  ? 0.9 %  sodium chloride infusion   Intravenous STAT Cox, Amy N, DO      ? acetaminophen (TYLENOL) tablet 1,000 mg  1,000 mg Oral Q6H PRN Cox, Amy N, DO      ? Or  ? acetaminophen (TYLENOL) suppository 650 mg  650 mg Rectal Q6H PRN Cox, Amy N, DO      ? acetaminophen (TYLENOL) tablet 1,000 mg  1,000 mg Oral Once Iran Ouch, MD      ? cefTRIAXone (ROCEPHIN) 1 g in sodium chloride 0.9 % 100 mL IVPB  1 g Intravenous Once Cox, Amy N, DO      ? [START ON 06/13/2021] cefTRIAXone (ROCEPHIN) 1 g in sodium chloride 0.9 % 100 mL IVPB  1 g Intravenous Q24H Cox, Amy N, DO      ? HYDROmorphone (DILAUDID) injection 1 mg  1 mg Intravenous Q4H PRN  Cox, Amy N, DO      ? morphine (PF) 2 MG/ML injection 2 mg  2 mg Intravenous Q4H PRN Cox, Amy N, DO      ? ondansetron (ZOFRAN) tablet 4 mg  4 mg Oral Q6H PRN Cox, Amy N, DO      ? Or  ? ondansetron (ZOFRAN) injection 4 mg  4 mg Intravenous Q6H PRN Cox, Amy N, DO      ? polyethylene glycol (MIRALAX / GLYCOLAX) packet 17 g  17 g Oral Daily PRN Cox, Amy N, DO      ? ?Current Outpatient Medications  ?Medication Sig Dispense Refill  ? acetaminophen (TYLENOL) 325 MG tablet Take 325 mg by mouth every 6 (six) hours as needed.    ? Cholecalciferol (VITAMIN D3) 1.25 MG (50000 UT) CAPS Take 50,000 Units by mouth once a week.    ? medroxyPROGESTERone (DEPO-PROVERA) 150 MG/ML injection Inject 150 mg into the muscle as directed.    ? ciprofloxacin (CIPRO) 500 MG tablet Take 500 mg by mouth 2 (two) times daily. (Patient not taking: Reported on 06/12/2021)    ? Prenatal-Fe Fum-Methf-FA w/o A (VITAFOL-NANO) 18-0.6-0.4 MG TABS Take 1 tablet by mouth daily. (Patient not taking: Reported on 06/12/2021) 30 tablet 11  ? venlafaxine XR (EFFEXOR-XR) 37.5 MG 24 hr capsule Take 75 mg by mouth daily. 1 capsule PO every morning for 2 weeks, then increase to 2 capsules per day (Patient not taking: Reported on 06/12/2021)    ? ? ? ?Objective: ?Vital signs in last 24 hours: ?BP 105/64   Pulse 63   Temp 97.8 ?F (36.6 ?C) (Oral)   Resp 19   Ht 5' 2.5" (1.588 m)   Wt 73.9 kg   SpO2 98%   BMI 29.34 kg/m?  ? ?Intake/Output from previous day: ?No intake/output data recorded. ?Intake/Output this shift: ?No intake/output data recorded. ? ? ?Physical Exam ?Vitals reviewed.  ?Constitutional:   ?   Appearance: Normal appearance.  ?Cardiovascular:  ?   Rate and Rhythm: Normal rate and regular rhythm.  ?   Heart sounds: Normal heart sounds.  ?Pulmonary:  ?   Effort: Pulmonary effort is normal.  ?   Breath sounds: Normal breath sounds.  ?Abdominal:  ?  General: Abdomen is flat.  ?   Palpations: Abdomen is soft. There is no mass.  ?   Tenderness: There is  left CVA tenderness.  ?Musculoskeletal:     ?   General: No swelling. Normal range of motion.  ?Skin: ?   General: Skin is warm and dry.  ?Neurological:  ?   General: No focal deficit present.  ?   Mental Status: She is alert and oriented to person, place, and time.  ?Psychiatric:     ?   Mood and Affect: Mood normal.     ?   Behavior: Behavior normal.  ?   Comments: But she is jittery with the narcotic  ? ? ?Lab Results:  ?Results for orders placed or performed during the hospital encounter of 06/12/21 (from the past 24 hour(s))  ?Urinalysis, Routine w reflex microscopic Urine, Clean Catch     Status: Abnormal  ? Collection Time: 06/12/21  3:25 AM  ?Result Value Ref Range  ? Color, Urine YELLOW (A) YELLOW  ? APPearance TURBID (A) CLEAR  ? Specific Gravity, Urine 1.018 1.005 - 1.030  ? pH 8.0 5.0 - 8.0  ? Glucose, UA NEGATIVE NEGATIVE mg/dL  ? Hgb urine dipstick SMALL (A) NEGATIVE  ? Bilirubin Urine NEGATIVE NEGATIVE  ? Ketones, ur NEGATIVE NEGATIVE mg/dL  ? Protein, ur NEGATIVE NEGATIVE mg/dL  ? Nitrite NEGATIVE NEGATIVE  ? Leukocytes,Ua SMALL (A) NEGATIVE  ? RBC / HPF 11-20 0 - 5 RBC/hpf  ? WBC, UA 21-50 0 - 5 WBC/hpf  ? Bacteria, UA RARE (A) NONE SEEN  ? Squamous Epithelial / LPF 6-10 0 - 5  ? Mucus PRESENT   ? Amorphous Crystal PRESENT   ?Basic metabolic panel     Status: Abnormal  ? Collection Time: 06/12/21  3:25 AM  ?Result Value Ref Range  ? Sodium 140 135 - 145 mmol/L  ? Potassium 3.8 3.5 - 5.1 mmol/L  ? Chloride 109 98 - 111 mmol/L  ? CO2 24 22 - 32 mmol/L  ? Glucose, Bld 152 (H) 70 - 99 mg/dL  ? BUN 11 6 - 20 mg/dL  ? Creatinine, Ser 0.89 0.44 - 1.00 mg/dL  ? Calcium 9.5 8.9 - 10.3 mg/dL  ? GFR, Estimated >60 >60 mL/min  ? Anion gap 7 5 - 15  ?CBC     Status: None  ? Collection Time: 06/12/21  3:25 AM  ?Result Value Ref Range  ? WBC 8.3 4.0 - 10.5 K/uL  ? RBC 4.64 3.87 - 5.11 MIL/uL  ? Hemoglobin 12.7 12.0 - 15.0 g/dL  ? HCT 38.8 36.0 - 46.0 %  ? MCV 83.6 80.0 - 100.0 fL  ? MCH 27.4 26.0 - 34.0 pg  ? MCHC  32.7 30.0 - 36.0 g/dL  ? RDW 12.9 11.5 - 15.5 %  ? Platelets 291 150 - 400 K/uL  ? nRBC 0.0 0.0 - 0.2 %  ?POC urine preg, ED     Status: None  ? Collection Time: 06/12/21  4:50 AM  ?Result Value Ref Range

## 2021-06-12 NOTE — Anesthesia Procedure Notes (Signed)
Procedure Name: LMA Insertion ?Date/Time: 06/12/2021 12:53 PM ?Performed by: Jerrye Noble, CRNA ?Pre-anesthesia Checklist: Patient identified, Emergency Drugs available, Suction available and Patient being monitored ?Patient Re-evaluated:Patient Re-evaluated prior to induction ?Oxygen Delivery Method: Circle system utilized ?Preoxygenation: Pre-oxygenation with 100% oxygen ?Induction Type: IV induction ?Ventilation: Mask ventilation without difficulty ?LMA: LMA inserted ?LMA Size: 3.5 ?Number of attempts: 1 ?Placement Confirmation: positive ETCO2 and breath sounds checked- equal and bilateral ?Tube secured with: Tape ?Dental Injury: Teeth and Oropharynx as per pre-operative assessment  ? ? ? ? ?

## 2021-06-12 NOTE — Assessment & Plan Note (Addendum)
-   Present on admission ?- Symptomatic with leukocytes in the UA ?- Query failed outpatient therapy with ciprofloxacin 500 mg p.o. twice daily ?- Continue ceftriaxone 1 g IV daily ?- Urine culture has been collected and is in process ?- CBC in the a.m. ?

## 2021-06-12 NOTE — Assessment & Plan Note (Signed)
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

## 2021-06-12 NOTE — Discharge Instructions (Signed)
I have including information above about the ureteral stent, kidney stones in general and the procedure you might possibly need to fix the tight spot where the kidney drainage system joins the tube to the bladder.   ?

## 2021-06-12 NOTE — H&P (Signed)
?History and Physical  ? ?Ashok CordiaFiona Bralley [id number] DOB: 24-May-1985 DOA: 06/12/2021 ? ?PCP: Dr. Ledell Peoplesita Lahlou, [facility] ?Patient coming from: Home ? ?I have personally briefly reviewed patient's [facility] medical [facility]. ? ?Chief Concern: Left flank pain ? ?HPI: Ms. Ames CoupeFiona McLean is a 36 year old female with history of depression, currently on birth control injections, recent UTI status post ciprofloxacin, who presents emergency department for chief concerns of left flank pain. ? ?Initial vitals showed temperature of 97.8, respiration rate 24 and improved to 12, heart rate 87, blood pressure 118/83, SPO2 of 898% on room air. ? ?Serum sodium 140, potassium 3.8, chloride 109, bicarb 24, BUN of 11, serum creatinine of 0.89, nonfasting blood glucose 152, GFR greater than 60, urine pregnancy test was negative, WBC 8.3, hemoglobin 12.7, platelets of 291. ? ?UA showed small leukocytes. ? ?Urology has been consulted per EDP and will see the patient. ? ?CT renal stone study: Was read as left-sided hydronephrosis secondary to 0.9 x 0.6 cm left UPJ calculus.  Marked distention of the left extrarenal pelvis is identified.  Bilateral nephrolithiasis ? ?ED treatment: Morphine 4 mg IV one-time dose, ketorolac 15 mg IV one-time dose, ondansetron 4 mg x 2, sodium chloride 125 mL/h was initiated and patient received ceftriaxone 1 g IV. ? ?At bedside she is able to tell me her name, her age, she knows she is in the hospital.  She does not appear to be in acute distress. ? ?She reports that she has been dealing with a UTI since February of this year.  She has been experience generalized abdominal pain with left-sided flank pain that has been mild since February. ? ?She reports that the day before admission, the left flank pain became worse, 10 out of 10 sharp pain that did not go away.  She denies fever, dysuria, hematuria, diarrhea, chest pain, shortness of breath. ? ?She endorses multiple  episodes of nausea and vomiting, too many times to count.  She has been vomiting up food and or bile since 06/11/2021. ? ?She endorses history of kidney stones. ? ?Social history: She lives at home with her 3 children.  She denies tobacco, EtOH, recreational drug use.  She currently works at [employer]. ? ?Vaccination history: She is vaccinated for COVID and influenza ? ?ROS: ?Constitutional: no weight change, no fever ?ENT/Mouth: no sore throat, no rhinorrhea ?Eyes: no eye pain, no vision changes ?Cardiovascular: no chest pain, no dyspnea,  no edema, no palpitations ?Respiratory: no cough, no sputum, no wheezing ?Gastrointestinal: no nausea, no vomiting, no diarrhea, no constipation ?Genitourinary: no urinary incontinence, no dysuria, no hematuria ?Musculoskeletal: no arthralgias, no myalgias ?Skin: no skin lesions, no pruritus, ?Neuro: + weakness, no loss of consciousness, no syncope ?Psych: no anxiety, no depression, + decrease appetite ?Heme/Lymph: no bruising, no bleeding ? ?ED Course: Discussed with emergency medicine provider, patient requiring hospitalization for chief concerns of UTI secondary to kidney stones. ? ?Assessment/Plan ? ?Principal Problem: ?  Hydronephrosis with urinary obstruction due to ureteral calculus ?Active Problems: ?  Depression ?  Left flank pain ?  UTI (urinary tract infection) ?  Follicular cystitis ?  Urinary tract obstruction by kidney stone ?  ?Assessment and Plan:. ? 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 ?Follicular cystitis ?- Patient will need outpatient follow-up with urology ?- Patient works at [employer] and would prefer to follow-up with [facility] urologist ?- Dr. Annabell Howells has referred patient  to Dr. Chrisandra Carota, [facility] urologist and Dr. Annabell Howells has sent a message to patient's PCP regarding this referral ?- Urologist has also recommended on discharge, patient would benefit from trimethoprim 100 mg p.o. nightly for the next 3 months to allow healing of the bladder inflammation.  A.m. team, please consider at least 1 month prescription for patient with referral to PCP and or [facility] urologist for the remainder of the prescription ? ?UTI (urinary tract infection) ?- Present on admission ?- Symptomatic with leukocytes in the UA ?- Query failed outpatient therapy with ciprofloxacin 500 mg p.o. twice daily ?- Continue ceftriaxone 1 g IV daily ?- Urine culture has been collected and is in process ?- CBC in the a.m. ? ?Left flank pain ?- Presumed secondary to left UPJ ?- Pain medications including acetaminophen 1000 mg p.o. every 6 hours.  For mild pain, fever, headache; morphine 2 mg IV every 4 hours.  For moderate pain, 1 day ordered; Dilaudid 1 mg IV every 4 hours as needed for severe pain, 1 day ordered ?- AM team/primary team to reassess daily for pain medication needs ? ?Depression ?- Patient was prescribed venlafaxine XR 75 mg daily, patient states she is no longer taking this medication, this has not been resumed ? ?Chart reviewed.  ? ?DVT prophylaxis: TED hose ?Code Status: Full code ?Diet: N.p.o. in anticipation of urologic procedure and then regular diet ?Family Communication: No ?Disposition Plan: Pending clinical course, anticipate discharge on 06/13/2021 ?Consults called: Urology, Dr. Annabell Howells ?Admission status: Inpatient, MedSurg ? ?Past Medical History:  ?Diagnosis Date  ? Abnormal Pap smear of cervix 2007  ? Anxiety   ? Bacterial vaginosis   ? Depression   ? Menorrhagia   ? Renal disorder   ? kidney stones  ? ?Past Surgical History:  ?Procedure Laterality Date  ? CYSTOSCOPY    ? ?Social History:  reports that she has never smoked. She has never used smokeless tobacco. She reports that she does not currently  use alcohol. She reports that she does not use drugs. ? ?Allergies  ?Allergen Reactions  ? Pineapple Rash  ? ?Family History  ?Problem Relation Age of Onset  ? Diabetes Mother   ?     type 2  ? Hypertension Mother   ? Diabetes Maternal Grandfather   ?     type 2  ? Cancer Maternal Grandfather 50  ?     lung  ? Lung cancer Father   ? ?Family history: Family history reviewed and not pertinent. ? ?Prior to Admission medications   ?Medication Sig Start Date End Date Taking? Authorizing Provider  ?acetaminophen (TYLENOL) 325 MG tablet Take 325 mg by mouth every 6 (six) hours as needed. 07/25/19  Yes [provider]  ?Cholecalciferol (VITAMIN D3) 1.25 MG (50000 UT) CAPS Take 50,000 Units by mouth once a week. 04/21/21  Yes [provider]  ?medroxyPROGESTERone (DEPO-PROVERA) 150 MG/ML injection Inject 150 mg into the muscle as directed. 11/24/20 11/19/21 Yes [provider]  ?ciprofloxacin (CIPRO) 500 MG tablet Take 500 mg by mouth 2 (two) times daily. ?Patient not taking: Reported on 06/12/2021 06/01/21   [provider]  ?Prenatal-Fe Fum-Methf-FA w/o A (VITAFOL-NANO) 18-0.6-0.4 MG TABS Take 1 tablet by mouth daily. ?Patient not taking: Reported  on 06/12/2021 12/27/18   Conard Novak, MD  ?venlafaxine XR (EFFEXOR-XR) 37.5 MG 24 hr capsule Take 75 mg by mouth daily. 1 capsule PO every morning for 2 weeks, then increase to 2 capsules per day ?Patient not taking: Reported on 06/12/2021 04/16/21   [provider]  ? ?Physical Exam: ?Vitals:  ? 06/12/21 1330 06/12/21 1400 06/12/21 1415 06/12/21 1430  ?BP: (!) 94/59 102/66 125/66 112/78  ?Pulse: (!) 56 (!) 57 63 (!) 49  ?Resp: (!) 24 15 11 11   ?Temp:    97.9 ?F (36.6 ?C)  ?TempSrc:      ?SpO2: 100% 100% 99% 98%  ?Weight:      ?Height:      ? ?Constitutional: appears age-appropriate, NAD, calm, comfortable ?Eyes: PERRL, lids and conjunctivae normal ?ENMT: Mucous membranes are moist. Posterior pharynx clear of any exudate or lesions.  Age-appropriate dentition. Hearing appropriate ?Neck: normal, supple, no masses, no thyromegaly ?Respiratory: clear to auscultation bilaterally, no wheezing, no crackles. Normal respiratory effort. No accessory

## 2021-06-12 NOTE — Op Note (Signed)
Procedure: 1.  Cystoscopy with left retrograde pyelogram and interpretation. ?2.  Cystoscopy with insertion of left double-J stent. ?3.  Application of fluoroscopy. ? ?Preop diagnosis: Left UPJ stone with UPJ obstruction. ? ?Postop diagnosis: Same with chronic follicular cystitis. ? ?Surgeon: Dr. Irine Seal. ? ?Anesthesia: General. ? ?Specimen: Urine culture from left renal pelvis. ? ?Drain: Left 6 [place] by 24 cm contour double-J stent without tether. ? ?EBL: None. ? ?Complications: None. ? ?Indications: The patient is a 36 year old female with a history of urolithiasis presented with the onset a week ago of mild left flank pain that became more severe last night and was associated with nausea and vomiting with some chills but no fever.  She had been on several rounds of antibiotics over the last 5 months including Macrobid and Cipro and recently completed Cipro.  Cultures were all multiple species.  Her urine today had red cells and white cells but some squamous epithelial cells suggestive of a contaminant.  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  There was also a similar sized stone in the lower pole of the kidney.  It was felt that cystoscopy with retrograde pyelography and stent placement was indicated for today but that she would subsequently likely need a robotic pyeloplasty with stone removal. ? ?Procedure: She was given Rocephin.  A general anesthetic was induced.  She was placed in the lithotomy position.  She was prepped with Betadine scrub and draped in usual sterile fashion. ? ?Cystoscopy was performed using the 21 [place] scope and 30 and 70 degree lenses.  Examination revealed a normal urethra.  The bladder urine was slightly turbid.  Inspection of the bladder wall revealed changes consistent with diffuse chronic follicular cystitis.  The ureteral orifices were unremarkable.  The right effluxed clear urine but the left did  not. ? ?The left ureteral orifice was tight but was cannulated with a 5 [place] open-ended catheter and contrast was instilled. ? ?The left retrograde pyelogram demonstrated a normal caliber ureter to the level of the UPJ where initially the stone was present but it flush back into the kidney.  Subsequent views demonstrated approximately 1 cm stenotic segment at the level of the UPJ.  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 ? ?A sensor wire was then advanced to the kidney through the open-ended catheter.  The open-ended catheter was advanced to the kidney and the wire was removed.  There was a brisk hydronephrotic drip and 10 mL of urine were aspirated to send for culture.  The urine was somewhat turbid on aspiration and more so as it drained from the kidney alongside the wire once it was replaced.  The open-ended catheter was removed and a 6 [place] by 24 cm contour double-J stent without tether was passed to the kidney under fluoroscopic guidance.  The wire was removed, leaving a good coil in the kidney and a good coil in the bladder.  The bladder was drained and the cystoscope was removed.  She was taken down from lithotomy position, her anesthetic was reversed and she was moved to recovery in stable condition.  There were no complications. ?

## 2021-06-12 NOTE — Assessment & Plan Note (Signed)
-   Presumed secondary to left UPJ ?- Pain medications including acetaminophen 1000 mg p.o. every 6 hours.  For mild pain, fever, headache; morphine 2 mg IV every 4 hours.  For moderate pain, 1 day ordered; Dilaudid 1 mg IV every 4 hours as needed for severe pain, 1 day ordered ?- AM team/primary team to reassess daily for pain medication needs ?

## 2021-06-12 NOTE — Assessment & Plan Note (Addendum)
-   Patient will need outpatient follow-up with urology ?- Patient works at [employer] and would prefer to follow-up with [facility] urologist ?- Dr. Annabell Howells has referred patient to Dr. Chrisandra Carota, [facility] urologist and Dr. Annabell Howells has sent a message to patient's PCP regarding this referral ?- Urologist has also recommended on discharge, patient would benefit from trimethoprim 100 mg p.o. nightly for the next 3 months to allow healing of the bladder inflammation.  A.m. team, please consider at least 1 month prescription for patient with referral to PCP and or [facility] urologist for the remainder of the prescription ?

## 2021-06-12 NOTE — Plan of Care (Signed)
  Problem: Education: Goal: Knowledge of General Education information will improve Description: Including pain rating scale, medication(s)/side effects and non-pharmacologic comfort measures Outcome: Progressing   Problem: Pain Managment: Goal: General experience of comfort will improve Outcome: Progressing   

## 2021-06-12 NOTE — Hospital Course (Signed)
Ms. Jessica Madden is a 36 year old female with history of depression, currently on birth control injections, recent UTI status post ciprofloxacin, who presents emergency department for chief concerns of left flank pain. ? ?Initial vitals showed temperature of 97.8, respiration rate 24 and improved to 12, heart rate 87, blood pressure 118/83, SPO2 of 898% on room air. ? ?Serum sodium 140, potassium 3.8, chloride 109, bicarb 24, BUN of 11, serum creatinine of 0.89, nonfasting blood glucose 152, GFR greater than 60, urine pregnancy test was negative, WBC 8.3, hemoglobin 12.7, platelets of 291. ? ?UA showed small leukocytes. ? ?Urology has been consulted per EDP and will see the patient. ? ?CT renal stone study: Was read as left-sided hydronephrosis secondary to 0.9 x 0.6 cm left UPJ calculus.  Marked distention of the left extrarenal pelvis is identified.  Bilateral nephrolithiasis ? ?ED treatment: Morphine 4 mg IV one-time dose, ketorolac 15 mg IV one-time dose, ondansetron 4 mg x 2, sodium chloride 125 mL/h was initiated and patient received ceftriaxone 1 g IV. ?

## 2021-06-12 NOTE — Anesthesia Postprocedure Evaluation (Signed)
Anesthesia Post Note ? ?Patient: Jessica Madden ? ?Procedure(s) Performed: CYSTOSCOPY WITH RETROGRADE PYELOGRAM/URETERAL STENT PLACEMENT (Left: Ureter) ? ?Patient location during evaluation: PACU ?Anesthesia Type: General ?Level of consciousness: awake and alert ?Pain management: pain level controlled ?Vital Signs Assessment: post-procedure vital signs reviewed and stable ?Respiratory status: spontaneous breathing, nonlabored ventilation and respiratory function stable ?Cardiovascular status: blood pressure returned to baseline and stable ?Postop Assessment: no apparent nausea or vomiting ?Anesthetic complications: no ? ? ?No notable events documented. ? ? ?Last Vitals:  ?Vitals:  ? 06/12/21 1510 06/12/21 1531  ?BP: 114/75 110/72  ?Pulse: (!) 55 (!) 56  ?Resp: 15 20  ?Temp: 36.7 ?C 36.7 ?C  ?SpO2: 100% 100%  ?  ?Last Pain:  ?Vitals:  ? 06/12/21 1430  ?TempSrc:   ?PainSc: 4   ? ? ?  ?  ?  ?  ?  ?  ? ?Foye Deer ? ? ? ? ?

## 2021-06-12 NOTE — ED Provider Notes (Signed)
? ?[facility] ?Provider Note ? ? ? Event Date/Time  ? First MD Initiated Contact with Patient 06/12/21 0541   ?  (approximate) ? ? ?History  ? ?Flank Pain ? ? ?HPI ? ?Jessica Madden is a 36 y.o. female with history of recurrent kidney stones never requiring previous intervention who has not been followed by urologist who presents to the emergency part with sudden onset, severe left-sided flank pain.  States she has had nausea and vomiting with this and has had a syncopal event due to the pain as well as a near syncopal event in the waiting room.  No chest pain or shortness of breath.  No fever.  She has had some dysuria and reports she has had 3 UTIs in the past several weeks and has taken 2 courses of Macrobid and just recently finished Cipro.  No diarrhea. ? ? ?Patient is not on blood thinners.  Reports she has been n.p.o. since 10 PM last night. ? ?History provided by patient. ? ? ? ?Past Medical History:  ?Diagnosis Date  ? Abnormal Pap smear of cervix 2007  ? Anxiety   ? Bacterial vaginosis   ? Depression   ? Menorrhagia   ? Renal disorder   ? kidney stones  ? ? ?History reviewed. No pertinent surgical history. ? ?MEDICATIONS:  ?Prior to Admission medications   ?Medication Sig Start Date End Date Taking? Authorizing Provider  ?Prenatal-Fe Fum-Methf-FA w/o A (VITAFOL-NANO) 18-0.6-0.4 MG TABS Take 1 tablet by mouth daily. 12/27/18   Will Bonnet, MD  ? ? ?Physical Exam  ? ?Triage Vital Signs: ?ED Triage Vitals  ?Enc Vitals Group  ?   BP 06/12/21 0324 118/83  ?   Pulse Rate 06/12/21 0324 87  ?   Resp 06/12/21 0324 (!) 24  ?   Temp 06/12/21 0324 97.8 ?F (36.6 ?C)  ?   Temp Source 06/12/21 0324 Oral  ?   SpO2 06/12/21 0324 99 %  ?   Weight 06/12/21 0321 163 lb (73.9 kg)  ?   Height 06/12/21 0321 5' 2.5" (1.588 m)  ?   Head Circumference --   ?   Peak Flow --   ?   Pain Score 06/12/21 0321 10  ?   Pain Loc --   ?   Pain Edu? --   ?   Excl. in [place]? --   ? ? ?Most recent vital signs: ?Vitals:   ? 06/12/21 0324 06/12/21 0615  ?BP: 118/83   ?Pulse: 87 87  ?Resp: (!) 24   ?Temp: 97.8 ?F (36.6 ?C)   ?SpO2: 99% 97%  ? ? ?CONSTITUTIONAL: Alert and oriented and responds appropriately to questions. Well-appearing; well-nourished ?HEAD: Normocephalic, atraumatic ?EYES: Conjunctivae clear, pupils appear equal, sclera nonicteric ?ENT: normal nose; moist mucous membranes ?NECK: Supple, normal ROM ?CARD: RRR; S1 and S2 appreciated; no murmurs, no clicks, no rubs, no gallops ?RESP: Normal chest excursion without splinting or tachypnea; breath sounds clear and equal bilaterally; no wheezes, no rhonchi, no rales, no hypoxia or respiratory distress, speaking full sentences ?ABD/GI: Normal bowel sounds; non-distended; soft, non-tender, no rebound, no guarding, no peritoneal signs ?BACK: The back appears normal ?EXT: Normal ROM in all joints; no deformity noted, no edema; no cyanosis ?SKIN: Normal color for age and race; warm; no rash on exposed skin ?NEURO: Moves all extremities equally, normal speech ?PSYCH: The patient's mood and manner are appropriate. ? ? ?ED Results / Procedures / Treatments  ? ?LABS: ?(all labs ordered  are listed, but only abnormal results are displayed) ?Labs Reviewed  ?URINALYSIS, ROUTINE W REFLEX MICROSCOPIC - Abnormal; Notable for the following components:  ?    Result Value  ? Color, Urine YELLOW (*)   ? APPearance TURBID (*)   ? Hgb urine dipstick SMALL (*)   ? Leukocytes,Ua SMALL (*)   ? Bacteria, UA RARE (*)   ? All other components within normal limits  ?BASIC METABOLIC PANEL - Abnormal; Notable for the following components:  ? Glucose, Bld 152 (*)   ? All other components within normal limits  ?URINE CULTURE  ?CBC  ?POC URINE PREG, ED  ? ? ? ?EKG: ? EKG Interpretation ? ?Date/Time:  Saturday Jun 12 2021 06:46:26 EDT ?Ventricular Rate:  61 ?PR Interval:  146 ?QRS Duration: 90 ?QT Interval:  436 ?QTC Calculation: 440 ?R Axis:   35 ?Text Interpretation: Sinus rhythm Low voltage, precordial  leads Confirmed by Pryor Curia [phone]) on 06/12/2021 6:48:25 AM ?  ? ?  ? ? ? ?RADIOLOGY: ?My personal review and interpretation of imaging: CT scan shows a 6 x 9 mm UPJ calculus on the left with hydronephrosis. ? ?I have personally reviewed all radiology reports.   ?CT Renal Stone Study ? ?Result Date: 06/12/2021 ?CLINICAL DATA:  Flank pain. EXAM: CT ABDOMEN AND PELVIS WITHOUT CONTRAST TECHNIQUE: Multidetector CT imaging of the abdomen and pelvis was performed following the standard protocol without IV contrast. RADIATION DOSE REDUCTION: This exam was performed according to the departmental dose-optimization program which includes automated exposure control, adjustment of the mA and/or kV according to patient size and/or use of iterative reconstruction technique. COMPARISON:  10/23/2017 FINDINGS: Lower chest: No acute abnormality. Hepatobiliary: No focal liver abnormality is seen. No gallstones, gallbladder wall thickening, or biliary dilatation. Pancreas: Unremarkable. No pancreatic ductal dilatation or surrounding inflammatory changes. Spleen: Normal in size and appearance. Adrenals/Urinary Tract: Bilateral kidney stones. Within the inferior pole of the right kidney there is a single stone measuring 5 mm. There is left-sided hydronephrosis with marked distension of the left external renal pelvis which measures 5.5 cm in diameter. Obstructing stone is identified at the left UPJ which measures 0.9 by 0.6 cm, image 67/5. Nonobstructing stone is identified within the dependent portion of the dilated proximal extrarenal pelvis measuring 5 mm, image 84/5. no calculi identified distal to the left UPJ. No signs of right-sided hydronephrosis or hydroureter. Urinary bladder appears decompressed. Stomach/Bowel: Stomach is within normal limits. Appendicolith is identified within the distal half of the appendix. Similar to previous exam. Tip of the appendix appears mildly thickened measuring 8 mm without surrounding  inflammatory fat stranding. No evidence of bowel wall thickening, distention, or inflammatory changes. Vascular/Lymphatic: No significant vascular findings are present. No enlarged abdominal or pelvic lymph nodes. Reproductive: Uterus and bilateral adnexa are unremarkable. Other: No free fluid or fluid collections. Musculoskeletal: No acute or significant osseous findings. IMPRESSION: 1. Left-sided hydronephrosis secondary to 0.9 x 0.6 cm left UPJ calculus. Marked distension of the left extrarenal pelvis is identified. 2. Bilateral nephrolithiasis. Electronically Signed   By: Kerby Moors M.D.   On: 06/12/2021 05:36   ? ? ?PROCEDURES: ? ?Critical Care performed: No ? ? ? ? ?.1-3 Lead EKG Interpretation ?Performed by: Lakeitha Basques, Delice Bison, DO ?Authorized by: Avamae Dehaan, Delice Bison, DO  ? ?  Interpretation: normal   ?  ECG rate:  87 ?  ECG rate assessment: normal   ?  Rhythm: sinus rhythm   ?  Ectopy: none   ?  Conduction: normal   ? ? ? ?IMPRESSION / MDM / ASSESSMENT AND PLAN / ED COURSE  ?I reviewed the triage vital signs and the nursing notes. ? ? ? ?Patient here with sudden onset left flank pain, nausea and vomiting and syncope. ? ?The patient is on the cardiac monitor to evaluate for evidence of arrhythmia and/or significant heart rate changes. ? ? ?DIFFERENTIAL DIAGNOSIS (includes but not limited to):   Kidney stone, UTI, pyelonephritis, suspect syncope secondary to pain and vasovagal reaction less likely ACS, dissection, PE, cardiac arrhythmia ? ? ?PLAN: We will obtain CBC, BMP, urinalysis, urine culture, EKG, CT renal study.  Patient given morphine, Toradol and Zofran from triage.  States her pain is improved but now coming back.  Will give Dilaudid. ? ? ?MEDICATIONS GIVEN IN ED: ?Medications  ?0.9 %  sodium chloride infusion ( Intravenous New Bag/Given 06/12/21 0605)  ?cefTRIAXone (ROCEPHIN) 1 g in sodium chloride 0.9 % 100 mL IVPB (has no administration in time range)  ?0.9 %  sodium chloride infusion (has no  administration in time range)  ?HYDROmorphone (DILAUDID) injection 0.5 mg (has no administration in time range)  ?ondansetron (ZOFRAN) injection 4 mg (has no administration in time range)  ?morphine (PF) 4 MG/ML injectio

## 2021-06-12 NOTE — Anesthesia Preprocedure Evaluation (Addendum)
Anesthesia Evaluation  ?Patient identified by MRN, date of birth, ID band ?Patient awake ? ? ? ?Reviewed: ?Allergy & Precautions, NPO status , Patient's Chart, lab work & pertinent test results ? ?Airway ?Mallampati: III ? ?TM Distance: >3 FB ?Neck ROM: full ? ? ? Dental ?no notable dental hx. ? ?  ?Pulmonary ?neg pulmonary ROS,  ?  ?Pulmonary exam normal ? ? ? ? ? ? ? Cardiovascular ?negative cardio ROS ?Normal cardiovascular exam ? ? ?  ?Neuro/Psych ?negative neurological ROS ? negative psych ROS  ? GI/Hepatic ?negative GI ROS, Neg liver ROS,   ?Endo/Other  ?negative endocrine ROS ? Renal/GU ?Renal diseaserecurrent kidney stones   ? ?  ?Musculoskeletal ? ? Abdominal ?(+)  ?Abdomen: tender. ? ?  ?Peds ? Hematology ?negative hematology ROS ?(+)   ?Anesthesia Other Findings ?Past Medical History: ?2007: Abnormal Pap smear of cervix ?No date: Anxiety ?No date: Bacterial vaginosis ?No date: Depression ?No date: Menorrhagia ?No date: Renal disorder ?    Comment:  kidney stones ? ? ?BMI   ? Body Mass Index: 29.34 kg/m?  ?  ? ? Reproductive/Obstetrics ?negative OB ROS ? ?  ? ? ? ? ? ? ? ? ? ? ? ? ? ?  ?  ? ? ? ? ? ? ? ?Anesthesia Physical ?Anesthesia Plan ? ?ASA: 2 ? ?Anesthesia Plan: General ETT  ? ?Post-op Pain Management: Tylenol PO (pre-op)*  ? ?Induction: Intravenous ? ?PONV Risk Score and Plan: Ondansetron, Dexamethasone, Midazolam and Treatment may vary due to age or medical condition ? ?Airway Management Planned: Oral ETT ? ?Additional Equipment:  ? ?Intra-op Plan:  ? ?Post-operative Plan: Extubation in OR ? ?Informed Consent: I have reviewed the patients History and Physical, chart, labs and discussed the procedure including the risks, benefits and alternatives for the proposed anesthesia with the patient or authorized representative who has indicated his/her understanding and acceptance.  ? ? ? ?Dental Advisory Given ? ?Plan Discussed with: Anesthesiologist, CRNA and  Surgeon ? ?Anesthesia Plan Comments:   ? ? ? ? ? ?Anesthesia Quick Evaluation ? ?

## 2021-06-12 NOTE — Transfer of Care (Signed)
Immediate Anesthesia Transfer of Care Note ? ?Patient: Jessica Madden ? ?Procedure(s) Performed: CYSTOSCOPY WITH RETROGRADE PYELOGRAM/URETERAL STENT PLACEMENT (Left: Ureter) ? ?Patient Location: PACU ? ?Anesthesia Type:General ? ?Level of Consciousness: drowsy and patient cooperative ? ?Airway & Oxygen Therapy: Patient Spontanous Breathing and Patient connected to face mask oxygen ? ?Post-op Assessment: Report given to RN and Post -op Vital signs reviewed and stable ? ?Post vital signs: Reviewed and stable ? ?Last Vitals:  ?Vitals Value Taken Time  ?BP 90/55 06/12/21 1325  ?Temp    ?Pulse 58 06/12/21 1328  ?Resp 23 06/12/21 1328  ?SpO2 100 % 06/12/21 1328  ?Vitals shown include unvalidated device data. ? ?Last Pain:  ?Vitals:  ? 06/12/21 0945  ?TempSrc:   ?PainSc: 6   ?   ? ?  ? ?Complications: No notable events documented. ?

## 2021-06-13 ENCOUNTER — Encounter: Payer: Self-pay | Admitting: Urology

## 2021-06-13 DIAGNOSIS — N132 Hydronephrosis with renal and ureteral calculous obstruction: Secondary | ICD-10-CM

## 2021-06-13 LAB — CBC
HCT: 39.5 % (ref 36.0–46.0)
Hemoglobin: 12.7 g/dL (ref 12.0–15.0)
MCH: 27.5 pg (ref 26.0–34.0)
MCHC: 32.2 g/dL (ref 30.0–36.0)
MCV: 85.7 fL (ref 80.0–100.0)
Platelets: 305 10*3/uL (ref 150–400)
RBC: 4.61 MIL/uL (ref 3.87–5.11)
RDW: 13.2 % (ref 11.5–15.5)
WBC: 15.5 10*3/uL — ABNORMAL HIGH (ref 4.0–10.5)
nRBC: 0 % (ref 0.0–0.2)

## 2021-06-13 LAB — BASIC METABOLIC PANEL
Anion gap: 4 — ABNORMAL LOW (ref 5–15)
BUN: 12 mg/dL (ref 6–20)
CO2: 23 mmol/L (ref 22–32)
Calcium: 8.9 mg/dL (ref 8.9–10.3)
Chloride: 113 mmol/L — ABNORMAL HIGH (ref 98–111)
Creatinine, Ser: 0.81 mg/dL (ref 0.44–1.00)
GFR, Estimated: >60 mL/min (ref >60)
Glucose, Bld: 115 mg/dL — ABNORMAL HIGH (ref 70–99)
Potassium: 4.1 mmol/L (ref 3.5–5.1)
Sodium: 140 mmol/L (ref 135–145)

## 2021-06-13 LAB — URINE CULTURE: Culture: <10000 — AB

## 2021-06-13 LAB — HIV ANTIBODY (ROUTINE TESTING W REFLEX): HIV Screen 4th Generation wRfx: NONREACTIVE

## 2021-06-13 MED ORDER — IPRATROPIUM-ALBUTEROL 0.5-2.5 (3) MG/3ML IN SOLN
3.0000 mL | RESPIRATORY_TRACT | Status: DC | PRN
Start: 2021-06-13 — End: 2021-06-14

## 2021-06-13 MED ORDER — SODIUM CHLORIDE 0.9 % IV SOLN
INTRAVENOUS | Status: AC
Start: 2021-06-13 — End: 2021-06-14

## 2021-06-13 MED ORDER — TRAZODONE HCL 50 MG PO TABS: 50.0000 mg | ORAL_TABLET | Freq: Every evening | ORAL | Status: DC | PRN

## 2021-06-13 MED ORDER — METOPROLOL TARTRATE 5 MG/5ML IV SOLN
5.0000 mg | INTRAVENOUS | Status: DC | PRN
Start: 2021-06-13 — End: 2021-06-14

## 2021-06-13 MED ORDER — SENNOSIDES-DOCUSATE SODIUM 8.6-50 MG PO TABS: 1.0000 | ORAL_TABLET | Freq: Every evening | ORAL | Status: DC | PRN

## 2021-06-13 MED ORDER — GUAIFENESIN 100 MG/5ML PO LIQD: 5.0000 mL | ORAL | Status: DC | PRN

## 2021-06-13 MED ORDER — OXYCODONE HCL 5 MG PO TABS: 5.0000 mg | ORAL_TABLET | ORAL | Status: DC | PRN

## 2021-06-13 MED ORDER — HYDRALAZINE HCL 20 MG/ML IJ SOLN
10.0000 mg | INTRAMUSCULAR | Status: DC | PRN
Start: 2021-06-13 — End: 2021-06-14

## 2021-06-13 NOTE — Plan of Care (Signed)
Patient is doing well

## 2021-06-13 NOTE — Progress Notes (Signed)
?PROGRESS NOTE ? ? ? ?Jessica Madden  TJQ:[id number] DOB: 27-Apr-1985 DOA: 06/12/2021 ?PCP: System, Provider Not In  ? ?Brief Narrative:  ?36 year old female with history of depression, birth control injection, recent UTI status post Cipro admitted for left-sided flank pain.  She was found to have left UPJ renal calculus with hydronephrosis along with bilateral nephrolithiasis.  She was taken for cystoscopy by urology on 5/13 and underwent double-J stent placement. ? ? ?Assessment & Plan: ? Principal Problem: ?  Hydronephrosis with urinary obstruction due to ureteral calculus ?Active Problems: ?  Depression ?  Left flank pain ?  UTI (urinary tract infection) ?  Follicular cystitis ?  Urinary tract obstruction by kidney stone ?  ? ? ?Assessment and Plan: ?Urinary tract obstruction by kidney stone ?Urinary tract infection ?- Left UPJ stone with UPJ obstruction status post cystoscopy by urology and stent placement.  Urology recommending that eventually she will need regular menses follow-up for probable left robotic pyeloplasty with pyelolithotomy ?- Urology has been consulted ?-Follow culture data. ?- Currently on IV Rocephin.  Follow culture data.  WBC still trending upwards. ? ?Follicular cystitis ?- Patient will need outpatient follow-up with urology ?- Patient works at [employer] and would prefer to follow-up with [facility] urologist ?- Dr. Annabell Howells has referred patient to Dr. Chrisandra Carota, [facility] urologist and Dr. Annabell Howells has sent a message to patient's PCP regarding this referral ?- Urologist has also recommended on discharge, patient would benefit from trimethoprim 100 mg p.o. nightly for the next 3 months to allow healing of the bladder inflammation.  A.m. team, please consider at least 1 month prescription for patient with referral to PCP and or [facility] urologist for the remainder of the prescription ? ?Left flank pain ?-Secondary to UTI but no evidence of pyelonephritis seen on the CT ? ?Depression ?- Patient was prescribed venlafaxine  XR 75 mg daily, patient states she is no longer taking this medication, this has not been resumed ? ? ? ? ?DVT prophylaxis: Place TED hose Start: 06/12/21 0738 ?Code Status: Full code ?Family Communication: Family is at bedside ? ?WBC slightly trending upwards, continue IV antibiotics and IV fluids.  Closely monitor for at least next 24 hours and until cleared by urology ? ?Subjective: ? ?Seen and examined at bedside, remains afebrile overnight.  Slight lower abdominal discomfort but no other complaints. ? ? ?Examination: ? ?General exam: Appears calm and comfortable  ?Respiratory system: Clear to auscultation. Respiratory effort normal. ?Cardiovascular system: S1 & S2 heard, RRR. No JVD, murmurs, rubs, gallops or clicks. No pedal edema. ?Gastrointestinal system: Abdomen is nondistended, soft and nontender. No organomegaly or masses felt. Normal bowel sounds heard. ?Central nervous system: Alert and oriented. No focal neurological deficits. ?Extremities: Symmetric 5 x 5 power. ?Skin: No rashes, lesions or ulcers ?Psychiatry: Judgement and insight appear normal. Mood & affect appropriate.  ? ? ? ?Objective: ?Vitals:  ? 06/12/21 1510 06/12/21 1531 06/12/21 2024 06/13/21 0542  ?BP: 114/75 110/72 109/68 98/61  ?Pulse: (!) 55 (!) 56 74 65  ?Resp: 15 20 20 18   ?Temp: 98 ?F (36.7 ?C) 98.1 ?F (36.7 ?C) 98.1 ?F (36.7 ?C) 98 ?F (36.7 ?C)  ?TempSrc:   Oral Oral  ?SpO2: 100% 100% 98% 99%  ?Weight:      ?Height:      ? ? ?Intake/Output Summary (Last 24 hours) at 06/13/2021 0751 ?Last data filed at 06/12/2021 1845 ?Gross per 24 hour  ?Intake 490 ml  ?Output --  ?Net 490 ml  ? ?Filed  Weights  ? 06/12/21 0321  ?Weight: 73.9 kg  ? ? ? ?Data Reviewed:  ? ?CBC: ?Recent Labs  ?Lab 06/12/21 ?0325 06/13/21 ?[id number]  ?WBC 8.3 15.5*  ?HGB 12.7 12.7  ?HCT 38.8 39.5  ?MCV 83.6 85.7  ?PLT 291 305  ? ?Basic Metabolic Panel: ?Recent Labs  ?Lab 06/12/21 ?0325 06/13/21 ?[id number]  ?NA 140 140  ?K 3.8 4.1  ?CL 109 113*  ?CO2 24 23  ?GLUCOSE 152* 115*  ?BUN  11 12  ?CREATININE 0.89 0.81  ?CALCIUM 9.5 8.9  ? ?GFR: ?Estimated Creatinine Clearance: 94 mL/min (by C-G formula based on SCr of 0.81 mg/dL). ?Liver Function Tests: ?No results for input(s): AST, ALT, ALKPHOS, BILITOT, PROT, ALBUMIN in the last 168 hours. ?No results for input(s): LIPASE, AMYLASE in the last 168 hours. ?No results for input(s): AMMONIA in the last 168 hours. ?Coagulation Profile: ?No results for input(s): INR, PROTIME in the last 168 hours. ?Cardiac Enzymes: ?No results for input(s): CKTOTAL, CKMB, CKMBINDEX, TROPONINI in the last 168 hours. ?BNP (last 3 results) ?No results for input(s): PROBNP in the last 8760 hours. ?HbA1C: ?No results for input(s): HGBA1C in the last 72 hours. ?CBG: ?No results for input(s): GLUCAP in the last 168 hours. ?Lipid Profile: ?No results for input(s): CHOL, HDL, LDLCALC, TRIG, CHOLHDL, LDLDIRECT in the last 72 hours. ?Thyroid Function Tests: ?No results for input(s): TSH, T4TOTAL, FREET4, T3FREE, THYROIDAB in the last 72 hours. ?Anemia Panel: ?No results for input(s): VITAMINB12, FOLATE, FERRITIN, TIBC, IRON, RETICCTPCT in the last 72 hours. ?Sepsis Labs: ?No results for input(s): PROCALCITON, LATICACIDVEN in the last 168 hours. ? ?No results found for this or any previous visit (from the past 240 hour(s)).  ? ? ? ? ? ?Radiology Studies: ?DG OR UROLOGY CYSTO IMAGE ([facility] ONLY) ? ?Result Date: 06/12/2021 ?There is no interpretation for this exam.  This order is for images obtained during a surgical procedure.  Please See "Surgeries" Tab for more information regarding the procedure.  ? ?DG OR UROLOGY CYSTO IMAGE ([facility] ONLY) ? ?Result Date: 06/12/2021 ?There is no interpretation for this exam.  This order is for images obtained during a surgical procedure.  Please See "Surgeries" Tab for more information regarding the procedure.  ? ?CT Renal Stone Study ? ?Result Date: 06/12/2021 ?CLINICAL DATA:  Flank pain. EXAM: CT ABDOMEN AND PELVIS WITHOUT CONTRAST TECHNIQUE:  Multidetector CT imaging of the abdomen and pelvis was performed following the standard protocol without IV contrast. RADIATION DOSE REDUCTION: This exam was performed according to the departmental dose-optimization program which includes automated exposure control, adjustment of the mA and/or kV according to patient size and/or use of iterative reconstruction technique. COMPARISON:  10/23/2017 FINDINGS: Lower chest: No acute abnormality. Hepatobiliary: No focal liver abnormality is seen. No gallstones, gallbladder wall thickening, or biliary dilatation. Pancreas: Unremarkable. No pancreatic ductal dilatation or surrounding inflammatory changes. Spleen: Normal in size and appearance. Adrenals/Urinary Tract: Bilateral kidney stones. Within the inferior pole of the right kidney there is a single stone measuring 5 mm. There is left-sided hydronephrosis with marked distension of the left external renal pelvis which measures 5.5 cm in diameter. Obstructing stone is identified at the left UPJ which measures 0.9 by 0.6 cm, image 67/5. Nonobstructing stone is identified within the dependent portion of the dilated proximal extrarenal pelvis measuring 5 mm, image 84/5. no calculi identified distal to the left UPJ. No signs of right-sided hydronephrosis or hydroureter. Urinary bladder appears decompressed. Stomach/Bowel: Stomach is within normal limits. Appendicolith is identified within  the distal half of the appendix. Similar to previous exam. Tip of the appendix appears mildly thickened measuring 8 mm without surrounding inflammatory fat stranding. No evidence of bowel wall thickening, distention, or inflammatory changes. Vascular/Lymphatic: No significant vascular findings are present. No enlarged abdominal or pelvic lymph nodes. Reproductive: Uterus and bilateral adnexa are unremarkable. Other: No free fluid or fluid collections. Musculoskeletal: No acute or significant osseous findings. IMPRESSION: 1. Left-sided  hydronephrosis secondary to 0.9 x 0.6 cm left UPJ calculus. Marked distension of the left extrarenal pelvis is identified. 2. Bilateral nephrolithiasis. Electronically Signed   By: Signa Kell M.D.   On: 06/12/2021

## 2021-06-13 NOTE — Plan of Care (Signed)
  Problem: Activity: Goal: Risk for activity intolerance will decrease Outcome: Progressing   Problem: Nutrition: Goal: Adequate nutrition will be maintained Outcome: Progressing   Problem: Pain Managment: Goal: General experience of comfort will improve Outcome: Progressing   

## 2021-06-14 LAB — BASIC METABOLIC PANEL
Anion gap: 7 (ref 5–15)
BUN: 10 mg/dL (ref 6–20)
CO2: 25 mmol/L (ref 22–32)
Calcium: 8.6 mg/dL — ABNORMAL LOW (ref 8.9–10.3)
Chloride: 110 mmol/L (ref 98–111)
Creatinine, Ser: 0.83 mg/dL (ref 0.44–1.00)
GFR, Estimated: >60 mL/min (ref >60)
Glucose, Bld: 106 mg/dL — ABNORMAL HIGH (ref 70–99)
Potassium: 3.8 mmol/L (ref 3.5–5.1)
Sodium: 142 mmol/L (ref 135–145)

## 2021-06-14 LAB — URINE CULTURE: Culture: NO GROWTH

## 2021-06-14 LAB — CBC
HCT: 38.3 % (ref 36.0–46.0)
Hemoglobin: 12.4 g/dL (ref 12.0–15.0)
MCH: 28.6 pg (ref 26.0–34.0)
MCHC: 32.4 g/dL (ref 30.0–36.0)
MCV: 88.5 fL (ref 80.0–100.0)
Platelets: 276 10*3/uL (ref 150–400)
RBC: 4.33 MIL/uL (ref 3.87–5.11)
RDW: 13.3 % (ref 11.5–15.5)
WBC: 9.7 10*3/uL (ref 4.0–10.5)
nRBC: 0 % (ref 0.0–0.2)

## 2021-06-14 LAB — MAGNESIUM: Magnesium: 2.1 mg/dL (ref 1.7–2.4)

## 2021-06-14 MED ORDER — POLYETHYLENE GLYCOL 3350 17 G PO PACK: 17.0000 g | PACK | Freq: Every day | ORAL | 0 refills | Status: AC | PRN

## 2021-06-14 MED ORDER — CIPROFLOXACIN HCL 500 MG PO TABS: 500.0000 mg | ORAL_TABLET | Freq: Two times a day (BID) | ORAL | 0 refills | Status: AC

## 2021-06-14 MED ORDER — OXYCODONE HCL 5 MG PO TABS: 5.0000 mg | ORAL_TABLET | Freq: Four times a day (QID) | ORAL | 0 refills | Status: AC | PRN

## 2021-06-14 NOTE — Discharge Summary (Signed)
Physician Discharge Summary  ?Jessica Madden ZOX:[id number] DOB: 06/17/1985 DOA: 06/12/2021 ? ?PCP: System, Provider Not In ? ?Admit date: 06/12/2021 ?Discharge date: 06/14/2021 ? ?Admitted From: Home ?Disposition: Home ? ?Recommendations for Outpatient Follow-up:  ?Follow up with PCP in 1-2 weeks ?Please obtain BMP/CBC in one week your next doctors visit.  ?Oral ciprofloxacin has been prescribed ?Urology to arrange for outpatient follow-up and [facility]. ? ? ?Discharge Condition: Stable ?CODE STATUS: Full code ?Diet recommendation: Regular ? ?Brief/Interim Summary: ?36 year old female with history of depression, birth control injection, recent UTI status post Cipro admitted for left-sided flank pain.  She was found to have left UPJ renal calculus with hydronephrosis along with bilateral nephrolithiasis.  She was taken for cystoscopy by urology on 5/13 and underwent double-J stent placement.  Patient's WBC improved on IV antibiotic therefore was transitioned to oral upon discharge.  Cultures remain negative.  Urology team will be arranging for outpatient follow[facility] as mentioned below. ?Medically stable for discharge today. ?  ?  ?Assessment & Plan: ? Principal Problem: ?  Hydronephrosis with urinary obstruction due to ureteral calculus ?Active Problems: ?  Depression ?  Left flank pain ?  UTI (urinary tract infection) ?  Follicular cystitis ?  Urinary tract obstruction by kidney stone ?  ?  ?  ?Assessment and Plan: ?Urinary tract obstruction by kidney stone ?Urinary tract infection ?- Left UPJ stone with UPJ obstruction status post cystoscopy by urology and stent placement.  Urology recommending that eventually she will need regular menses follow-up for probable left robotic pyeloplasty with pyelolithotomy ?- Seen by urology, cultures remain negative.  Will transition to oral Cipro upon discharge ?  ?Follicular cystitis ?- Patient will need outpatient follow-up with urology ?- Patient  works at [facility] and would [facility] ?- Dr. Annabell HowellsWrenn has referred patient to Dr. Chrisandra CarotaGapal Narang, [facility] and Dr. Annabell HowellsWrenn has sent a message to patient's PCP regarding this referral.  This will be arranged by urology service. ?  ?Left flank pain ?-Secondary to UTI but no evidence of pyelonephritis seen on the CT ?  ?Depression ?- Patient states she is not taking venlafaxine. ? ? ?Discharge Diagnoses:  ?Principal Problem: ?  Hydronephrosis with urinary obstruction due to ureteral calculus ?Active Problems: ?  Depression ?  Left flank pain ?  UTI (urinary tract infection) ?  Follicular cystitis ?  Urinary tract obstruction by kidney stone ? ? ? ? ? ?Consultations: ?Urology ? ?Subjective: ?Feels great remains afebrile.  Wishes to go home.  She understands that her outpatient follow-up will be arranged by urology service including referral to [facility] ? ?Discharge Exam: ?Vitals:  ? 06/14/21 0417 06/14/21 0827  ?BP: 95/61 114/82  ?Pulse: 63 65  ?Resp: 16 16  ?Temp: 98 ?F (36.7 ?C) 99 ?F (37.2 ?C)  ?SpO2: 98% 100%  ? ?Vitals:  ? 06/13/21 1811 06/13/21 1952 06/14/21 0417 06/14/21 0827  ?BP: (!) 100/51 107/75 95/61 114/82  ?Pulse: 67 84 63 65  ?Resp: 18 20 16 16   ?Temp: 98.9 ?F (37.2 ?C) 99.2 ?F (37.3 ?C) 98 ?F (36.7 ?C) 99 ?F (37.2 ?C)  ?TempSrc:   Oral Oral  ?SpO2: 99% 98% 98% 100%  ?Weight:      ?Height:      ? ? ?General: Pt is alert, awake, not in acute distress ?Cardiovascular: RRR, S1/S2 +, no rubs, no gallops ?Respiratory: CTA bilaterally, no wheezing, no rhonchi ?Abdominal: Soft, NT, ND, bowel sounds + ?Extremities: no  edema, no cyanosis ? ?Discharge Instructions ? ? ?Allergies as of 06/14/2021   ? ?   Reactions  ? Pineapple Rash  ? ?  ? ?  ?Medication List  ?  ? ?STOP taking these medications   ? ?venlafaxine XR 37.5 MG 24 hr capsule ?Commonly known as: EFFEXOR-XR ?  ?Vitafol-Nano 18-0.6-0.4 MG Tabs ?  ? ?  ? ?TAKE these medications   ? ?acetaminophen 325 MG tablet ?Commonly known as:  TYLENOL ?Take 325 mg by mouth every 6 (six) hours as needed. ?  ?ciprofloxacin 500 MG tablet ?Commonly known as: CIPRO ?Take 1 tablet (500 mg total) by mouth 2 (two) times daily for 7 days. ?  ?medroxyPROGESTERone 150 MG/ML injection ?Commonly known as: DEPO-PROVERA ?Inject 150 mg into the muscle as directed. ?  ?oxyCODONE 5 MG immediate release tablet ?Commonly known as: Oxy IR/ROXICODONE ?Take 1 tablet (5 mg total) by mouth every 6 (six) hours as needed for moderate pain or severe pain. ?  ?polyethylene glycol 17 g packet ?Commonly known as: MIRALAX / GLYCOLAX ?Take 17 g by mouth daily as needed for mild constipation. ?  ?Vitamin D3 1.25 MG (50000 UT) Caps ?Take 50,000 Units by mouth once a week. ?  ? ?  ? ? Follow-up Information   ? ? Bjorn Pippin, MD Follow up.   ?Specialty: Urology ?Why: Please call to make an appointment as soon as possible to Dr. Orion Modest with [facility].   I have asked the [facility] office to facilitate the referral, but please call to expedite the appointment. ? ?[facility] ?[facility], 2nd Floor ?[address] ?[place], [place] ?Phone: [phone] ?Fax: [phone]     will send referral to [facility] ?Contact information: ?[address] ?[address] ?[place] [address] ?[phone] ? ? ?  ?  ? ?  ?  ? ?  ? ?Allergies  ?Allergen Reactions  ? Pineapple Rash  ? ? ?You were cared for by a hospitalist during your hospital stay. If you have any questions about your discharge medications or the care you received while you were in the hospital after you are discharged, you can call the unit and asked to speak with the hospitalist on call if the hospitalist that took care of you is not available. Once you are discharged, your primary care physician will handle any further medical issues. Please note that no refills for any discharge medications will be authorized once you are discharged, as it is imperative that you return to your primary care  physician (or establish a relationship with a primary care physician if you do not have one) for your aftercare needs so that they can reassess your need for medications and monitor your lab values. ? ? ?Procedures/Studies: ?DG OR UROLOGY CYSTO IMAGE ([facility] ONLY) ? ?Result Date: 06/12/2021 ?There is no interpretation for this exam.  This order is for images obtained during a surgical procedure.  Please See "Surgeries" Tab for more information regarding the procedure.  ? ?DG OR UROLOGY CYSTO IMAGE ([facility] ONLY) ? ?Result Date: 06/12/2021 ?There is no interpretation for this exam.  This order is for images obtained during a surgical procedure.  Please See "Surgeries" Tab for more information regarding the procedure.  ? ?CT Renal Stone Study ? ?Result Date: 06/12/2021 ?CLINICAL DATA:  Flank pain. EXAM: CT ABDOMEN AND PELVIS WITHOUT CONTRAST TECHNIQUE: Multidetector CT imaging of the abdomen and pelvis was performed following the standard protocol without IV contrast. RADIATION  DOSE REDUCTION: This exam was performed according to the departmental dose-optimization program which includes automated exposure control, adjustment of the mA and/or kV according to patient size and/or use of iterative reconstruction technique. COMPARISON:  10/23/2017 FINDINGS: Lower chest: No acute abnormality. Hepatobiliary: No focal liver abnormality is seen. No gallstones, gallbladder wall thickening, or biliary dilatation. Pancreas: Unremarkable. No pancreatic ductal dilatation or surrounding inflammatory changes. Spleen: Normal in size and appearance. Adrenals/Urinary Tract: Bilateral kidney stones. Within the inferior pole of the right kidney there is a single stone measuring 5 mm. There is left-sided hydronephrosis with marked distension of the left external renal pelvis which measures 5.5 cm in diameter. Obstructing stone is identified at the left UPJ which measures 0.9 by 0.6 cm, image 67/5. Nonobstructing stone is identified within the  dependent portion of the dilated proximal extrarenal pelvis measuring 5 mm, image 84/5. no calculi identified distal to the left UPJ. No signs of right-sided hydronephrosis or hydroureter. Urinary bladder appears decompr

## 2021-06-16 ENCOUNTER — Telehealth: Payer: Self-pay | Admitting: Urology

## 2021-06-16 NOTE — Telephone Encounter (Signed)
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   She is probably going to need a robotic pyeloplasty and stone removal and would like to be referred to [facility] for further care since she works there.    I would like for her to see Dr. Fredrik Rigger, who is a stone specialist at [facility].  Please expedite the referral.  She will have a stent so it would be nice if they could see her in the next 2-3 weeks.    ? ?[facility] Office  ?[facility], 2nd Floor  ?[address]  ?[place], [place]  ?Phone: [phone]  ?Fax: [phone]  ? ? ?Patient has been referred and has an appt on 7/12 @ [facility] ? ? ?Sharyn Lull ?

## 2022-09-20 IMAGING — CT CT RENAL STONE PROTOCOL
3 of 4 series · 7 of 46 positions shown, 13 images · non-contrast
Comparison: 10/23/2017

CLINICAL DATA: Flank pain.



[Series 4: lung bases · axial · 0.70mm/px · z∈[-534,-489]mm · 3 of 19 slices shown, 7 images]
[im 5/19  soft-tissue]
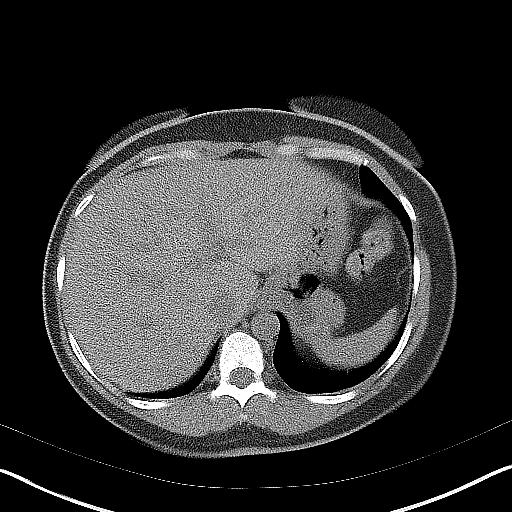
[im 5/19  lung]
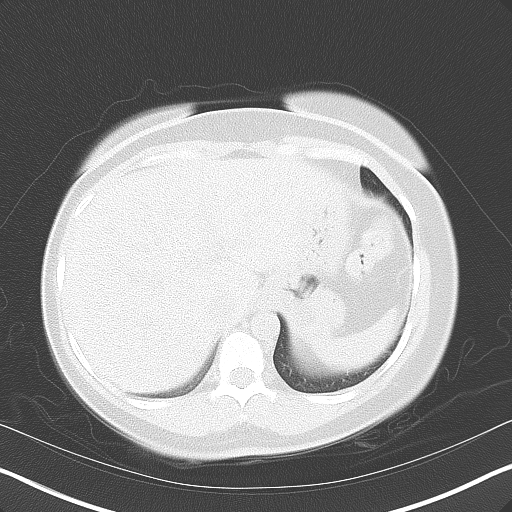
[im 5/19  bone]
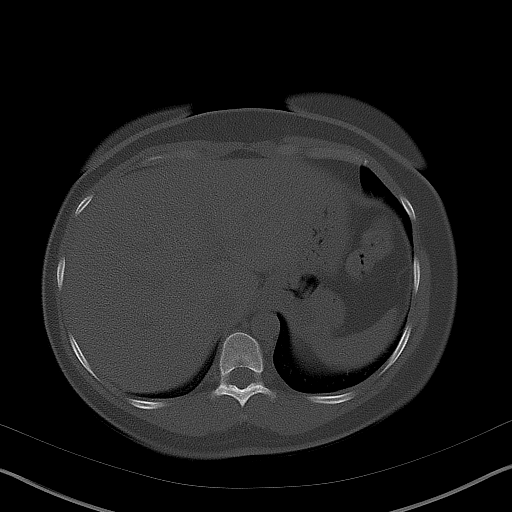
[im 10/19  soft-tissue]
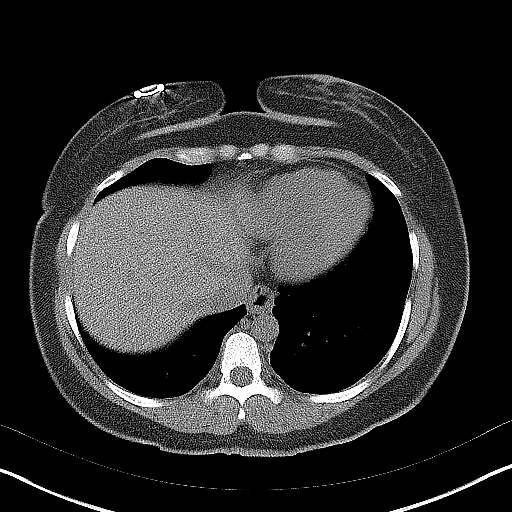
[im 10/19  lung]
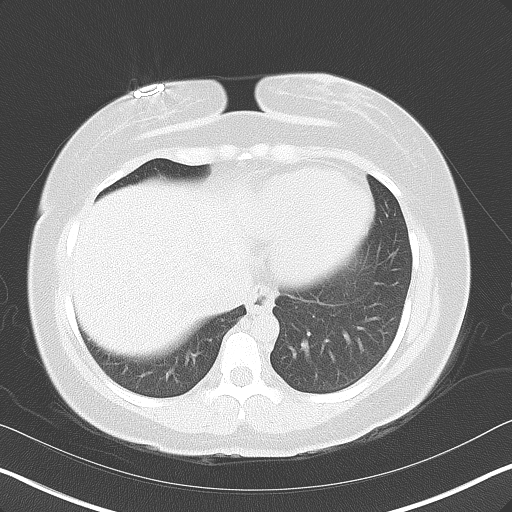
[im 14/19  soft-tissue]
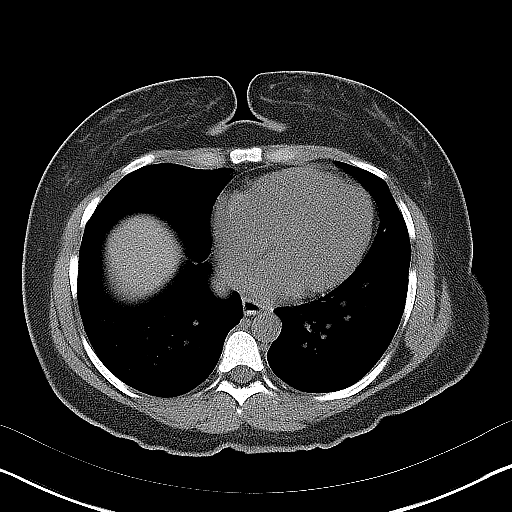
[im 14/19  lung]
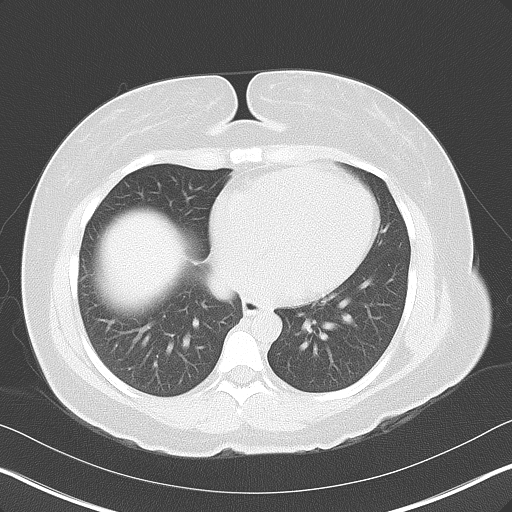

[Series 5: coronal · coronal · 0.70mm/px · 3 of 128 slices shown, 4 images]
[im 43/128  soft-tissue]
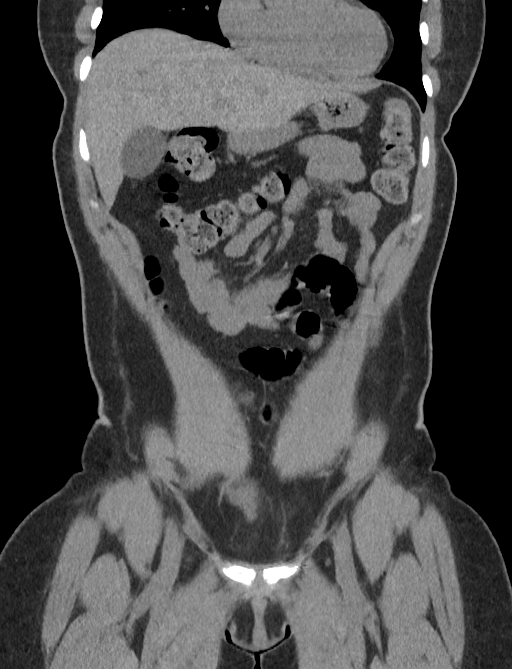
[im 57/128  soft-tissue]
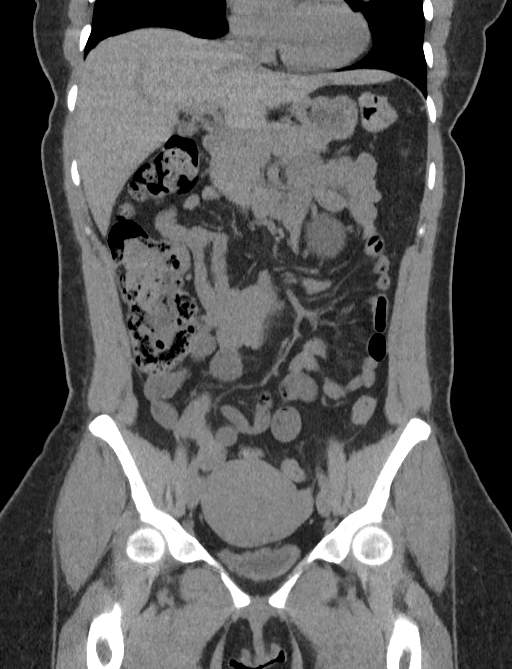
[im 57/128  bone]
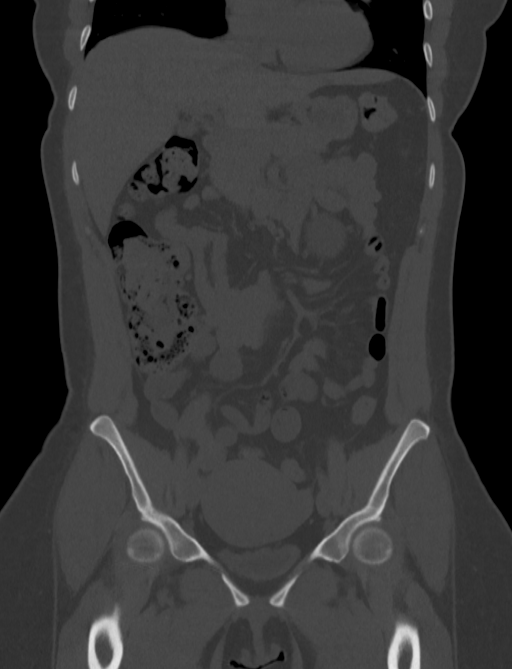
[im 71/128  soft-tissue]
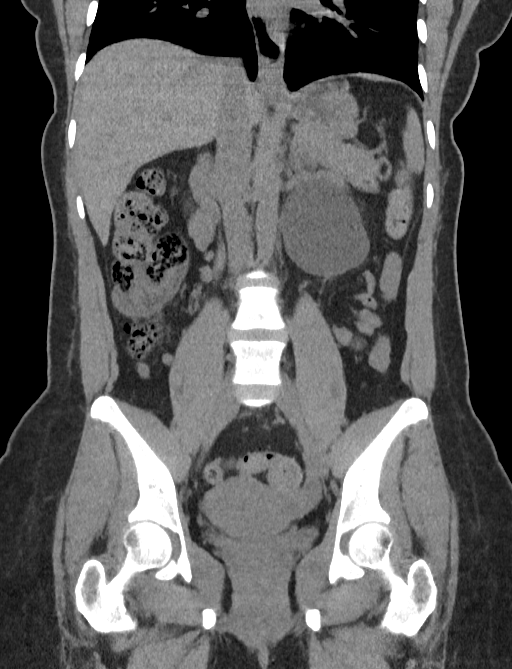

[Series 6: sagittal · sagittal · 0.50mm/px · 1 of 180 slices shown, 2 images]
[im 60/180  soft-tissue]
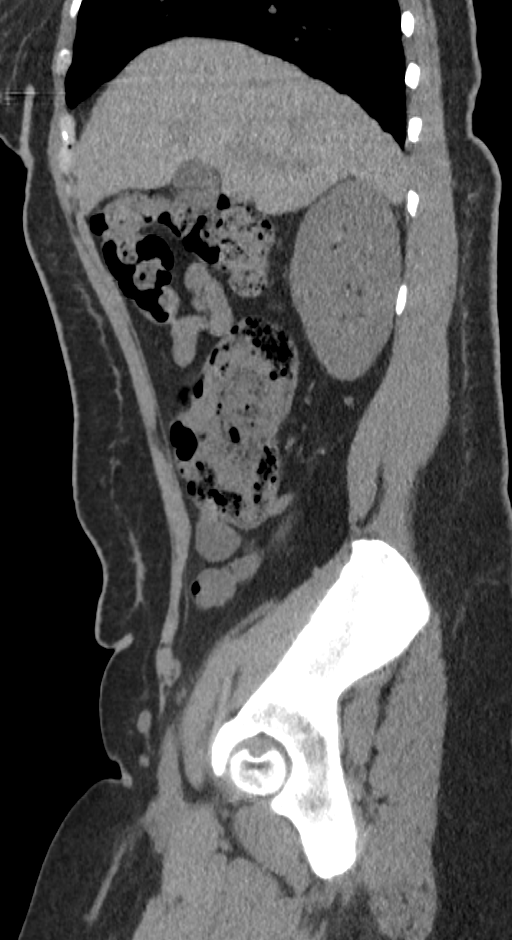
[im 60/180  bone]
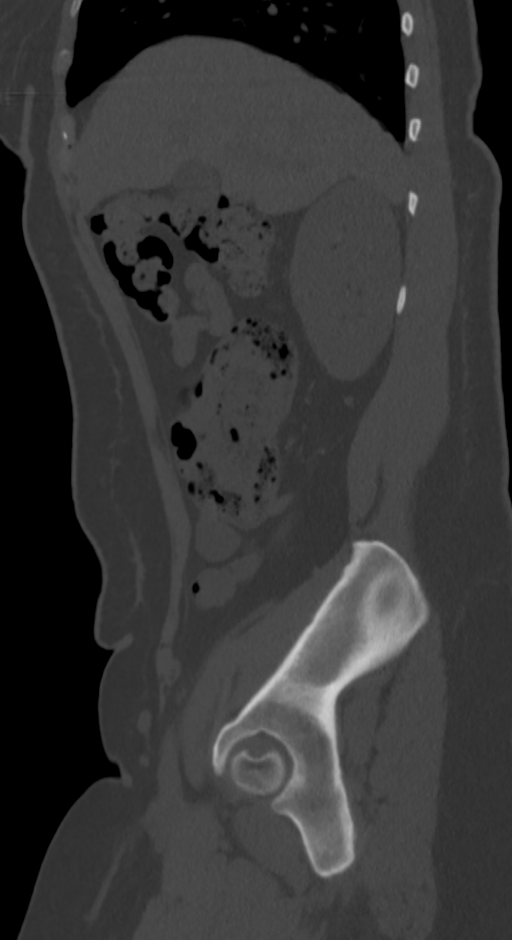

[7 of 46 positions shown; findings below may reference images not displayed]

FINDINGS: Lower chest: No acute abnormality.

Hepatobiliary: No focal liver abnormality is seen. No gallstones,
gallbladder wall thickening, or biliary dilatation.

Pancreas: Unremarkable. No pancreatic ductal dilatation or
surrounding inflammatory changes.

Spleen: Normal in size and appearance.

Adrenals/Urinary Tract: Bilateral kidney stones. Within the inferior
pole of the right kidney there is a single stone measuring 5 mm.
There is left-sided hydronephrosis with marked distension of the
left external renal pelvis which measures 5.5 cm in diameter.
Obstructing stone is identified at the left UPJ which measures
by 0.6 cm, image 67/5. Nonobstructing stone is identified within the
dependent portion of the dilated proximal extrarenal pelvis
measuring 5 mm, image 84/5. no calculi identified distal to the left
UPJ. No signs of right-sided hydronephrosis or hydroureter. Urinary
bladder appears decompressed.

Stomach/Bowel: Stomach is within normal limits. Appendicolith is
identified within the distal half of the appendix. Similar to
previous exam. Tip of the appendix appears mildly thickened
measuring 8 mm without surrounding inflammatory fat stranding. No
evidence of bowel wall thickening, distention, or inflammatory
changes.

Vascular/Lymphatic: No significant vascular findings are present. No
enlarged abdominal or pelvic lymph nodes.

Reproductive: Uterus and bilateral adnexa are unremarkable.

Other: No free fluid or fluid collections.

Musculoskeletal: No acute or significant osseous findings.
IMPRESSION: 1. Left-sided hydronephrosis secondary to 0.9 x 0.6 cm left UPJ
calculus. Marked distension of the left extrarenal pelvis is
identified.
2. Bilateral nephrolithiasis.
